# Patient Record
Sex: Female | Born: 1995
Health system: Southern US, Community
[De-identification: ages and names within clinical notes are randomized; demographics above are authoritative.]

## PROBLEM LIST (undated history)

## (undated) DIAGNOSIS — K3189 Other diseases of stomach and duodenum: Secondary | ICD-10-CM

## (undated) DIAGNOSIS — F329 Major depressive disorder, single episode, unspecified: Secondary | ICD-10-CM

## (undated) DIAGNOSIS — K589 Irritable bowel syndrome without diarrhea: Secondary | ICD-10-CM

## (undated) DIAGNOSIS — M797 Fibromyalgia: Secondary | ICD-10-CM

## (undated) DIAGNOSIS — F32A Depression, unspecified: Secondary | ICD-10-CM

## (undated) DIAGNOSIS — F419 Anxiety disorder, unspecified: Secondary | ICD-10-CM

## (undated) DIAGNOSIS — Z803 Family history of malignant neoplasm of breast: Secondary | ICD-10-CM

## (undated) DIAGNOSIS — F319 Bipolar disorder, unspecified: Secondary | ICD-10-CM

## (undated) HISTORY — DX: Other diseases of stomach and duodenum: K31.89

## (undated) HISTORY — PX: GANGLION CYST EXCISION: SHX1691

## (undated) HISTORY — DX: Family history of malignant neoplasm of breast: Z80.3

## (undated) HISTORY — DX: Bipolar disorder, unspecified: F31.9

## (undated) HISTORY — DX: Irritable bowel syndrome, unspecified: K58.9

## (undated) HISTORY — DX: Fibromyalgia: M79.7

---
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
## 2015-02-16 ENCOUNTER — Ambulatory Visit (INDEPENDENT_AMBULATORY_CARE_PROVIDER_SITE_OTHER): Payer: BLUE CROSS/BLUE SHIELD | Admitting: Family Medicine

## 2015-02-16 VITALS — BP 110/60 | HR 63 | Temp 99.4°F | Resp 16 | Ht 68.0 in | Wt 185.0 lb

## 2015-02-16 DIAGNOSIS — R21 Rash and other nonspecific skin eruption: Secondary | ICD-10-CM | POA: Diagnosis not present

## 2015-02-16 DIAGNOSIS — R509 Fever, unspecified: Secondary | ICD-10-CM | POA: Diagnosis not present

## 2015-02-16 LAB — POCT CBC
Granulocyte percent: 77.8 %G (ref 37–80)
HCT, POC: 39.6 % (ref 37.7–47.9)
Hemoglobin: 13.2 g/dL (ref 12.2–16.2)
Lymph, poc: 1.9 (ref 0.6–3.4)
MCH, POC: 30.2 pg (ref 27–31.2)
MCHC: 33.4 g/dL (ref 31.8–35.4)
MCV: 90.3 fL (ref 80–97)
MID (cbc): 0.7 (ref 0–0.9)
MPV: 7.6 fL (ref 0–99.8)
POC Granulocyte: 8.9 — AB (ref 2–6.9)
POC LYMPH PERCENT: 16.2 %L (ref 10–50)
POC MID %: 6 %M (ref 0–12)
Platelet Count, POC: 222 10*3/uL (ref 142–424)
RBC: 4.39 M/uL (ref 4.04–5.48)
RDW, POC: 12.7 %
WBC: 11.5 10*3/uL — AB (ref 4.6–10.2)

## 2015-02-16 LAB — COMPREHENSIVE METABOLIC PANEL
ALT: 8 U/L (ref 5–32)
AST: 14 U/L (ref 12–32)
Albumin: 4.2 g/dL (ref 3.6–5.1)
Alkaline Phosphatase: 55 U/L (ref 47–176)
BUN: 9 mg/dL (ref 7–20)
CO2: 23 mmol/L (ref 20–31)
Calcium: 9.2 mg/dL (ref 8.9–10.4)
Chloride: 101 mmol/L (ref 98–110)
Creat: 0.76 mg/dL (ref 0.50–1.00)
Glucose, Bld: 77 mg/dL (ref 65–99)
Potassium: 4.1 mmol/L (ref 3.8–5.1)
Sodium: 141 mmol/L (ref 135–146)
Total Bilirubin: 0.4 mg/dL (ref 0.2–1.1)
Total Protein: 7 g/dL (ref 6.3–8.2)

## 2015-02-16 LAB — POCT URINALYSIS DIPSTICK
Glucose, UA: NEGATIVE
Ketones, UA: 15
Leukocytes, UA: NEGATIVE
Nitrite, UA: NEGATIVE
Protein, UA: 30
Spec Grav, UA: 1.025
Urobilinogen, UA: 1
pH, UA: 5.5

## 2015-02-16 LAB — POCT RAPID STREP A (OFFICE): Rapid Strep A Screen: NEGATIVE

## 2015-02-16 LAB — POCT UA - MICROSCOPIC ONLY
Bacteria, U Microscopic: NEGATIVE
Casts, Ur, LPF, POC: NEGATIVE
Crystals, Ur, HPF, POC: NEGATIVE
Yeast, UA: NEGATIVE

## 2015-02-16 LAB — POCT URINE PREGNANCY: Preg Test, Ur: NEGATIVE

## 2015-02-16 LAB — POCT SEDIMENTATION RATE: POCT SED RATE: 30 mm/hr — AB (ref 0–22)

## 2015-02-16 MED ORDER — DOXYCYCLINE HYCLATE 100 MG PO CAPS: 100.0000 mg | ORAL_CAPSULE | Freq: Two times a day (BID) | ORAL | Status: DC

## 2015-02-16 MED ORDER — HYDROCODONE-ACETAMINOPHEN 7.5-325 MG/15ML PO SOLN: 5.0000 mL | ORAL | Status: DC | PRN

## 2015-02-16 MED ORDER — PROMETHAZINE HCL 25 MG PO TABS: 25.0000 mg | ORAL_TABLET | Freq: Three times a day (TID) | ORAL | Status: DC | PRN

## 2015-02-16 NOTE — Progress Notes (Signed)
Subjective:    Patient ID: Kathy Cole, female    DOB: March 03, 1996, 19 y.o.   MRN: [id number] This chart was scribed for Norberto Sorenson, MD by Jolene Provost, Medical Scribe. This patient was seen in Room 1 and the patient's care was started a 12:20 PM.  Chief Complaint  Patient presents with  . Abrasion    Onset Saturday Bilateral arms and legs  . Nasal Congestion    onset saturday  . Sore Throat    " "  . Fever    " "    HPI HPI Comments: Kathy Cole is a 18 y.o. female who presents to [facility] complaining of cough, fever, sore throat and rash for the last four days. Pt endorses associated bilateral ear pain, worse with swallowing. Pt states her sx started with cough and sore throat five days ago, and three days ago she developed a fever and rash. Her fever and rash cleared two days ago, and then recurred yesterday and was dramatically worse today. The rash has spread all over her body. Pt denies abdominal pain, vaginal discharge, diarrhea, or vomiting.   She states she has been outside a lot this summer, but is not aware of being bitten by a tick. Pt is UTD on immunizations.   Past Medical History  Diagnosis Date  . Anxiety   . Depression    Current Outpatient Prescriptions on File Prior to Visit  Medication Sig Dispense Refill  . clonazePAM (KLONOPIN) 1 MG tablet Take 0.5-1 mg by mouth 2 (two) times daily as needed for anxiety.    . divalproex (DEPAKOTE ER) 250 MG 24 hr tablet Take 3 tablets (750 mg total) by mouth at bedtime. Mood control 30 tablet 0  . gabapentin (NEURONTIN) 100 MG capsule Take 1 capsule (100 mg total) by mouth 3 (three) times daily. 90 capsule 0  . levonorgestrel (MIRENA) 20 MCG/24HR IUD 1 each by Intrauterine route once.     No current facility-administered medications on file prior to visit.   No Known Allergies  Review of Systems  Constitutional: Positive for fever, chills, diaphoresis, activity change, appetite change and fatigue. Negative for unexpected weight  change.  HENT: Positive for congestion, ear pain, postnasal drip, rhinorrhea, sneezing, sore throat, trouble swallowing and voice change. Negative for ear discharge, hearing loss, mouth sores and nosebleeds.   Respiratory: Positive for cough. Negative for shortness of breath.   Gastrointestinal: Negative for nausea, vomiting, abdominal pain, diarrhea, constipation and abdominal distention.  Endocrine: Negative for polyuria.  Genitourinary: Positive for decreased urine volume.  Musculoskeletal: Positive for myalgias and neck pain. Negative for neck stiffness.  Skin: Positive for rash.  Allergic/Immunologic: Negative for immunocompromised state.  Neurological: Positive for dizziness, light-headedness and headaches. Negative for seizures, syncope and speech difficulty.  Hematological: Positive for adenopathy.  Psychiatric/Behavioral: Negative for sleep disturbance.       Objective:  BP 110/60 mmHg  Pulse 63  Temp(Src) 99.4 F (37.4 C) (Oral)  Resp 16  Ht 5\' 8"  (1.727 m)  Wt 185 lb (83.915 kg)  BMI 28.14 kg/m2  SpO2 93%  Physical Exam  Constitutional: She is oriented to person, place, and time. She appears well-developed and well-nourished. No distress.  HENT:  Head: Normocephalic and atraumatic.  Right TM normal. Left TM bulging and erythematous. Oropharynx tonsils with severe erythema, 2+ with copious exudates.   Eyes: Pupils are equal, round, and reactive to light.  Neck: Neck supple.  Cardiovascular: Normal rate, S1 normal and S2 normal.  No murmur heard. Tachycardic with regular rhythm.   Pulmonary/Chest: Effort normal. No respiratory distress.  Abdominal: Bowel sounds are normal. There is no tenderness. There is no rebound.  Negative Murphy's sign, no hepatosplenomegaly.   Musculoskeletal: Normal range of motion.  Neurological: She is alert and oriented to person, place, and time. Coordination normal.  Skin: Skin is warm and dry. She is not diaphoretic.  Blanching pinpoint  erythematous macules starting on the palms and MCP joints and fingers going up arm coalescing to a blanching erythematous macules over extensor surface of elbows and axilla. Face is spared. Increased rash at waistline and groin.   Psychiatric: She has a normal mood and affect. Her behavior is normal.  Nursing note and vitals reviewed.      Assessment & Plan:   1. Fever, unspecified fever cause   2. Rash and nonspecific skin eruption   Suspect RMSF as pt is VERY spotted but no known tick exposure so could be extreme viral exanthem.  Start doxy - get in 2 doses/d and recheck w/ [facility] in 24 hrs unless having substantial improvements - then f/u w/ me in 2-3d.  If worse or any alarm sxs -> to ER immed. Pt does live w/ several roommates who will be avail to help monitor her illness in case of worsening.  Rest, push fluids. oow x 3d -> rec provider note for RTW release when improving.  Orders Placed This Encounter  Procedures  . Culture, Group A Strep  . Epstein-Barr virus VCA antibody panel  . [place] spotted fvr abs pnl(IgG+IgM)  . Comprehensive metabolic panel  . POCT CBC  . POCT SEDIMENTATION RATE  . POCT UA - Microscopic Only  . POCT urinalysis dipstick  . POCT urine pregnancy  . POCT rapid strep A    Meds ordered this encounter  Medications  . Brexpiprazole (REXULTI) 2 MG TABS    Sig: Take 1 tablet by mouth once.  . doxycycline (VIBRAMYCIN) 100 MG capsule    Sig: Take 1 capsule (100 mg total) by mouth 2 (two) times daily.    Dispense:  20 capsule    Refill:  0  . HYDROcodone-acetaminophen (HYCET) 7.5-325 mg/15 ml solution    Sig: Take 5-10 mLs by mouth every 4 (four) hours as needed for moderate pain.    Dispense:  140 mL    Refill:  0  . promethazine (PHENERGAN) 25 MG tablet    Sig: Take 1 tablet (25 mg total) by mouth every 8 (eight) hours as needed for nausea or vomiting.    Dispense:  20 tablet    Refill:  0  . HYDROcodone-acetaminophen (HYCET) 7.5-325 mg/15 ml  solution    Sig: Take 5-10 mLs by mouth every 4 (four) hours as needed for moderate pain.    Dispense:  140 mL    Refill:  0    I personally performed the services described in this documentation, which was scribed in my presence. The recorded information has been reviewed and considered, and addended by me as needed.  Norberto Sorenson, MD MPH  Results for orders placed or performed in visit on 02/16/15  Culture, Group A Strep  Result Value Ref Range   Organism ID, Bacteria Normal Upper Respiratory Flora    Organism ID, Bacteria No Beta Hemolytic Streptococci Isolated   Epstein-Barr virus VCA antibody panel  Result Value Ref Range   EBV VCA IgG 178.0 (H) <18.0 U/mL   EBV VCA IgM <10.0 <36.0 U/mL   EBV  EA IgG <5.0 <9.0 U/mL   EBV NA IgG 335.0 (H) <18.0 U/mL  [place] spotted fvr abs pnl(IgG+IgM)  Result Value Ref Range   RMSF IgG 3.40 (H) IV   RMSF IgM 0.12 IV  Comprehensive metabolic panel  Result Value Ref Range   Sodium 141 135 - 146 mmol/L   Potassium 4.1 3.8 - 5.1 mmol/L   Chloride 101 98 - 110 mmol/L   CO2 23 20 - 31 mmol/L   Glucose, Bld 77 65 - 99 mg/dL   BUN 9 7 - 20 mg/dL   Creat 1.61 0.96 - 0.45 mg/dL   Total Bilirubin 0.4 0.2 - 1.1 mg/dL   Alkaline Phosphatase 55 47 - 176 U/L   AST 14 12 - 32 U/L   ALT 8 5 - 32 U/L   Total Protein 7.0 6.3 - 8.2 g/dL   Albumin 4.2 3.6 - 5.1 g/dL   Calcium 9.2 8.9 - 40.9 mg/dL  POCT CBC  Result Value Ref Range   WBC 11.5 (A) 4.6 - 10.2 K/uL   Lymph, poc 1.9 0.6 - 3.4   POC LYMPH PERCENT 16.2 10 - 50 %L   MID (cbc) 0.7 0 - 0.9   POC MID % 6.0 0 - 12 %M   POC Granulocyte 8.9 (A) 2 - 6.9   Granulocyte percent 77.8 37 - 80 %G   RBC 4.39 4.04 - 5.48 M/uL   Hemoglobin 13.2 12.2 - 16.2 g/dL   HCT, POC 81.1 91.4 - 47.9 %   MCV 90.3 80 - 97 fL   MCH, POC 30.2 27 - 31.2 pg   MCHC 33.4 31.8 - 35.4 g/dL   RDW, POC 78.2 %   Platelet Count, POC 222 142 - 424 K/uL   MPV 7.6 0 - 99.8 fL  POCT SEDIMENTATION RATE  Result Value Ref Range    POCT SED RATE 30 (A) 0 - 22 mm/hr  POCT UA - Microscopic Only  Result Value Ref Range   WBC, Ur, HPF, POC 0-3    RBC, urine, microscopic 3-8    Bacteria, U Microscopic neg    Mucus, UA large    Epithelial cells, urine per micros 4-6    Crystals, Ur, HPF, POC neg    Casts, Ur, LPF, POC neg    Yeast, UA neg   POCT urinalysis dipstick  Result Value Ref Range   Color, UA Dk yellow    Clarity, UA clear    Glucose, UA neg    Bilirubin, UA small    Ketones, UA 15    Spec Grav, UA 1.025    Blood, UA trace-lysed    pH, UA 5.5    Protein, UA 30    Urobilinogen, UA 1.0    Nitrite, UA neg    Leukocytes, UA Negative Negative  POCT urine pregnancy  Result Value Ref Range   Preg Test, Ur Negative Negative  POCT rapid strep A  Result Value Ref Range   Rapid Strep A Screen Negative Negative

## 2015-02-16 NOTE — Patient Instructions (Signed)
[place] Spotted Fever °[place] Spotted Fever (RMSF) is the oldest known tick-borne disease of people in the [place]. This disease was named because it was first described among people in the [place] area who had an illness characterized by a rash with red-purple-black spots. This disease is caused by a rickettsia (Rickettsia rickettsii), a bacteria carried by the tick. °The [place] wood tick and the [place] dog tick acquire and transmit the RMSF bacteria (pictures NOT actual size). When a larval, nymphal, or adult tick feeds on an infected rodent or larger animal, the tick can become infected. Infected adult ticks then feed on people who may then get RMSF. The tick transmits the disease to humans during a prolonged period of feeding that lasts many hours, days, or even a couple weeks. The bite is painless and frequently goes unnoticed. An infected female tick may also pass the rickettsial bacteria to her eggs that then may mature to be infected adult ticks. °The rickettsia that causes RMSF can also get into a person's body through damaged skin. A tick bite is not necessary. People can get RMSF if they crush a tick and get its blood or body fluids on their skin through a small cut or sore.  °DIAGNOSIS °Diagnosis is made by laboratory tests.  °TREATMENT °Treatment is with antibiotics (medications that kill rickettsia and other bacteria). Immediate treatment usually prevents death. °GEOGRAPHIC RANGE °This disease was reported only in the [place] until 1931. RMSF has more recently been described among individuals in all states except [place], [place], and [place]. The highest reported incidences of RMSF now occur among residents of [place], [place], [place], and the [place]. °TIME OF YEAR  °Most cases are diagnosed during late spring and summer when ticks are most active. However, especially in the warmer southern states, a few cases occur during the winter. °SYMPTOMS   °· Symptoms of RMSF begin from 2 to 14 days after a tick bite. The most common early symptoms are fever, muscle aches, and headache followed by nausea (feeling sick to your stomach) or vomiting. °· The RMSF rash is typically delayed until 3 or more days after symptom onset, and eventually develops in 9 of 10 infected patients by the fifth day of illness. °If the disease is not treated it can cause death. If you get a fever, headache, muscle aches, rash, nausea, or vomiting within 2 weeks of a possible tick bite or exposure, you should see your caregiver immediately. °PREVENTION °Ticks prefer to hide in shady, moist ground litter. They can often be found above the ground clinging to tall grass, brush, shrubs and low tree branches. They also inhabit lawns and gardens, especially at the edges of woodlands and around old stone walls. Within the areas where ticks generally live, no naturally vegetated area can be considered completely free of infected ticks. The best precaution against RMSF is to avoid contact with soil, leaf litter, and vegetation as much as possible in tick-infested areas. For those who enjoy gardening or walking in their yards, clear brush and mow tall grass around houses and at the edges of gardens. This may help reduce the tick population in the immediate area. Applications of chemical insecticides by a licensed professional in the spring (late May) and fall (September) will also control ticks, especially in heavily infested areas. Treatment will never get rid of all the ticks. Getting rid of small animal populations that host ticks will also decrease the tick population. When working in the garden, pruning   shrubs, or handling soil and vegetation, wear light-colored protective clothing and gloves. Spot-check often to prevent ticks from reaching the skin. Ticks cannot jump or fly. They will not drop from an above-ground perch onto a passing animal. Once a tick gains access to human skin it climbs  upward until it reaches a more protected area. For example, the back of the knee, groin, navel, armpit, ears, or nape of the neck. It then begins the slow process of embedding itself in the skin. °Campers, hikers, field workers, and others who spend time in wooded, brushy, or tall grassy areas can avoid exposure to ticks by using the following precautions: °· Wear light-colored clothing with a tight weave to spot ticks more easily and prevent contact with the skin. °· Wear long pants tucked into socks, long-sleeved shirts tucked into pants and enclosed shoes or boots along with insect repellent. °· Spray clothes with insect repellent containing either DEET or Permethrin. Only DEET can be used on exposed skin. Follow the manufacturer's directions carefully. °· Wear a hat and keep long hair pulled back. °· Stay on cleared, well-worn trails whenever possible. °· Spot-check yourself and others often for the presence of ticks on clothes. If you find one, there are likely to be others. Check thoroughly. °· Remove clothes after leaving tick-infested areas. If possible, wash them to eliminate any unseen ticks. Check yourself, your children and any pets from head to toe for the presence of ticks. °· Shower and shampoo. °You can greatly reduce your chances of contracting RMSF if you remove attached ticks as soon as possible. Regular checks of the body, including all body sites covered by hair (head, armpits, genitals), allow removal of the tick before rickettsial transmission. To remove an attached tick, use a forceps or tweezers to detach the intact tick without leaving mouth parts in the skin. The tick bite wound should be cleansed after tick removal. °Remember the most common symptoms of RMSF are fever, muscle aches, headache, and nausea or vomiting with a later onset of rash. If you get these symptoms after a tick bite and while living in an area where RMSF is found, RMSF should be suspected. If the disease is not  treated, it can cause death. See your caregiver immediately if you get these symptoms. Do this even if not aware of a tick bite. °Document Released: 10/09/2000 Document Revised: 11/11/2013 Document Reviewed: 06/01/2009 °ExitCare® Patient Information ©2015 [employer]. This information is not intended to replace advice given to you by your health care provider. Make sure you discuss any questions you have with your health care provider. ° °

## 2015-02-17 LAB — EPSTEIN-BARR VIRUS VCA ANTIBODY PANEL
EBV EA IgG: <5 U/mL (ref <9.0)
EBV NA IgG: 335 U/mL — ABNORMAL HIGH (ref <18.0)
EBV VCA IgG: 178 U/mL — ABNORMAL HIGH (ref <18.0)
EBV VCA IgM: <10 U/mL (ref <36.0)

## 2015-02-17 LAB — ROCKY MTN SPOTTED FVR ABS PNL(IGG+IGM)
RMSF IgG: 3.4 IV — ABNORMAL HIGH
RMSF IgM: 0.12 IV

## 2015-02-18 LAB — CULTURE, GROUP A STREP: Organism ID, Bacteria: NORMAL

## 2015-03-01 ENCOUNTER — Encounter: Payer: Self-pay | Admitting: Family Medicine

## 2015-11-17 ENCOUNTER — Ambulatory Visit (INDEPENDENT_AMBULATORY_CARE_PROVIDER_SITE_OTHER): Payer: BLUE CROSS/BLUE SHIELD | Admitting: Family Medicine

## 2015-11-17 VITALS — BP 118/76 | HR 61 | Temp 97.9°F | Resp 16 | Ht 68.0 in | Wt 186.0 lb

## 2015-11-17 DIAGNOSIS — R112 Nausea with vomiting, unspecified: Secondary | ICD-10-CM

## 2015-11-17 DIAGNOSIS — R300 Dysuria: Secondary | ICD-10-CM

## 2015-11-17 DIAGNOSIS — R109 Unspecified abdominal pain: Secondary | ICD-10-CM

## 2015-11-17 DIAGNOSIS — N390 Urinary tract infection, site not specified: Secondary | ICD-10-CM | POA: Diagnosis not present

## 2015-11-17 DIAGNOSIS — R8281 Pyuria: Secondary | ICD-10-CM

## 2015-11-17 LAB — COMPLETE METABOLIC PANEL WITH GFR
ALT: 10 U/L (ref 6–29)
AST: 13 U/L (ref 10–30)
Albumin: 4.3 g/dL (ref 3.6–5.1)
Alkaline Phosphatase: 60 U/L (ref 33–115)
BUN: 13 mg/dL (ref 7–25)
CO2: 21 mmol/L (ref 20–31)
Calcium: 9.4 mg/dL (ref 8.6–10.2)
Chloride: 104 mmol/L (ref 98–110)
Creat: 0.68 mg/dL (ref 0.50–1.10)
GFR, Est African American: >89 mL/min (ref >=60)
GFR, Est Non African American: >89 mL/min (ref >=60)
Glucose, Bld: 89 mg/dL (ref 65–99)
Potassium: 4.4 mmol/L (ref 3.5–5.3)
Sodium: 136 mmol/L (ref 135–146)
Total Bilirubin: 0.5 mg/dL (ref 0.2–1.2)
Total Protein: 7.2 g/dL (ref 6.1–8.1)

## 2015-11-17 LAB — POCT CBC
Granulocyte percent: 75.2 %G (ref 37–80)
HCT, POC: 39.3 % (ref 37.7–47.9)
Hemoglobin: 14.2 g/dL (ref 12.2–16.2)
Lymph, poc: 2.6 (ref 0.6–3.4)
MCH, POC: 31.9 pg — AB (ref 27–31.2)
MCHC: 36.1 g/dL — AB (ref 31.8–35.4)
MCV: 88.3 fL (ref 80–97)
MID (cbc): 0.7 (ref 0–0.9)
MPV: 7.5 fL (ref 0–99.8)
POC Granulocyte: 10 — AB (ref 2–6.9)
POC LYMPH PERCENT: 19.5 %L (ref 10–50)
POC MID %: 5.3 %M (ref 0–12)
Platelet Count, POC: 260 10*3/uL (ref 142–424)
RBC: 4.46 M/uL (ref 4.04–5.48)
RDW, POC: 12.7 %
WBC: 13.3 10*3/uL — AB (ref 4.6–10.2)

## 2015-11-17 LAB — POCT URINALYSIS DIP (MANUAL ENTRY)
Bilirubin, UA: NEGATIVE
Glucose, UA: NEGATIVE
Ketones, POC UA: NEGATIVE
Nitrite, UA: NEGATIVE
Protein Ur, POC: 30 — AB
Spec Grav, UA: 1.02
Urobilinogen, UA: 0.2
pH, UA: 5.5

## 2015-11-17 LAB — POC MICROSCOPIC URINALYSIS (UMFC): Mucus: ABSENT

## 2015-11-17 LAB — POCT URINE PREGNANCY: Preg Test, Ur: NEGATIVE

## 2015-11-17 MED ORDER — KETOROLAC TROMETHAMINE 60 MG/2ML IM SOLN
60.0000 mg | Freq: Once | INTRAMUSCULAR | Status: AC
  Administered 2015-11-17: 60 mg via INTRAMUSCULAR

## 2015-11-17 MED ORDER — CIPROFLOXACIN HCL 250 MG PO TABS: 500.0000 mg | ORAL_TABLET | Freq: Two times a day (BID) | ORAL | Status: DC

## 2015-11-17 MED ORDER — ONDANSETRON 8 MG PO TBDP: 8.0000 mg | ORAL_TABLET | Freq: Three times a day (TID) | ORAL | Status: DC | PRN

## 2015-11-17 MED ORDER — CIPROFLOXACIN HCL 500 MG PO TABS: 500.0000 mg | ORAL_TABLET | Freq: Two times a day (BID) | ORAL | Status: DC

## 2015-11-17 MED ORDER — HYDROCODONE-ACETAMINOPHEN 5-325 MG PO TABS: 1.0000 | ORAL_TABLET | Freq: Four times a day (QID) | ORAL | Status: DC | PRN

## 2015-11-17 NOTE — Progress Notes (Signed)
Patient ID: Kathy Cole MRN: [id number], DOB: [birth year], 20 y.o. Date of Encounter: 11/17/2015, 10:23 AM  Primary Physician: No primary care provider on file.  Chief Complaint:  Chief Complaint  Patient presents with  . Nausea    Since this morning  . Back Pain    Lower, since yesterday    HPI: 20 y.o. year old female presents with 4 day history of dysuria, urgency, and frequency. Last UTI was about a month ago No hematuria LMP: irregular, last one in February No sick contacts, recent antibiotics(She was treated for a UTI 6 weeks ago with antibiotics), or recent travels.   No vaginal discharge,  fever  Patient is nauseated with vomiting.  Past Medical History  Diagnosis Date  . Anxiety   . Depression      Home Meds: Prior to Admission medications   Medication Sig Start Date End Date Taking? Authorizing Provider  clonazePAM (KLONOPIN) 1 MG tablet Take 0.5-1 mg by mouth 2 (two) times daily as needed for anxiety. Reported on 11/17/2015    Historical Provider, MD  levonorgestrel (MIRENA) 20 MCG/24HR IUD 1 each by Intrauterine route once. Reported on 11/17/2015    Historical Provider, MD    Allergies: No Known Allergies  Social History   Social History  . Marital Status: Single    Spouse Name: N/A  . Number of Children: N/A  . Years of Education: N/A   Occupational History  . Not on file.   Social History Main Topics  . Smoking status: Former Games developer  . Smokeless tobacco: Not on file  . Alcohol Use: 0.0 oz/week    0 Standard drinks or equivalent per week     Comment: 3 drinks liquor at a party monthly  . Drug Use: Yes    Special: Marijuana     Comment: THC once month 1/8th gram  . Sexual Activity: Yes    Birth Control/ Protection: IUD   Other Topics Concern  . Not on file   Social History Narrative     Review of Systems: Constitutional: negative for chills, fever, night sweats or weight changes Cardiovascular: negative for chest pain or  palpitations Respiratory: negative for hemoptysis, wheezing, or shortness of breath Abdominal: negative for abdominal pain, nausea, vomiting or diarrhea Dermatological: negative for rash Neurologic: negative for headache   Physical Exam: Blood pressure 118/76, pulse 61, temperature 97.9 F (36.6 C), temperature source Oral, resp. rate 16, height  (1.727 m), weight 186 lb (84.369 kg), SpO2 97 %., Body mass index is 28.29 kg/(m^2). General: Well developed, well nourished, in no acute distress. Head: Normocephalic, atraumatic, eyes without discharge, sclera non-icteric, nares are congested. Bilateral auditory canals clear, TM's are without perforation, pearly grey with reflective cone of light bilaterally. Serous effusion bilaterally behind TM's. Maxillary sinus TTP. Oral cavity moist, dentition normal. Posterior pharynx with post nasal drip and mild erythema. No peritonsillar abscess or tonsillar exudate. Neck: Supple. No thyromegaly. Full ROM. No lymphadenopathy. Lungs: Coarse breath sounds bilaterally without Clear bilaterally to auscultation without wheezes, rales, or rhonchi. Breathing is unlabored.  Heart: RRR with S1 S2. No murmurs, rubs, or gallops appreciated. Abdomen: Soft, non-tender, non-distended with normoactive bowel sounds. No hepatosplenomegaly. No rebound/guarding. No obvious abdominal masses. McBurney's, Rovsing's, Iliopsoas, and table jar all negative. Msk:  Strength and tone normal for age. Extremities: No clubbing or cyanosis. No edema. Neuro: Alert and oriented X 3. Moves all extremities spontaneously. CNII-XII grossly in tact. Psych:  Responds to questions appropriately with a normal  affect.   Labs: Results for orders placed or performed in visit on 11/17/15  POCT urinalysis dipstick  Result Value Ref Range   Color, UA yellow yellow   Clarity, UA clear clear   Glucose, UA negative negative   Bilirubin, UA negative negative   Ketones, POC UA negative negative    Spec Grav, UA 1.020    Blood, UA moderate (A) negative   pH, UA 5.5    Protein Ur, POC =30 (A) negative   Urobilinogen, UA 0.2    Nitrite, UA Negative Negative   Leukocytes, UA Trace (A) Negative  POCT Microscopic Urinalysis ([facility])  Result Value Ref Range   WBC,UR,HPF,POC Many (A) None WBC/hpf   RBC,UR,HPF,POC Moderate (A) None RBC/hpf   Bacteria Many (A) None, Too numerous to count   Mucus Absent Absent   Epithelial Cells, UR Per Microscopy Many (A) None, Too numerous to count cells/hpf  POCT urine pregnancy  Result Value Ref Range   Preg Test, Ur Negative Negative  POCT CBC  Result Value Ref Range   WBC 13.3 (A) 4.6 - 10.2 K/uL   Lymph, poc 2.6 0.6 - 3.4   POC LYMPH PERCENT 19.5 10 - 50 %L   MID (cbc) 0.7 0 - 0.9   POC MID % 5.3 0 - 12 %M   POC Granulocyte 10.0 (A) 2 - 6.9   Granulocyte percent 75.2 37 - 80 %G   RBC 4.46 4.04 - 5.48 M/uL   Hemoglobin 14.2 12.2 - 16.2 g/dL   HCT, POC 16.139.3 09.637.7 - 47.9 %   MCV 88.3 80 - 97 fL   MCH, POC 31.9 (A) 27 - 31.2 pg   MCHC 36.1 (A) 31.8 - 35.4 g/dL   RDW, POC 04.512.7 %   Platelet Count, POC 260 142 - 424 K/uL   MPV 7.5 0 - 99.8 fL   Patient had significant relief after receiving the Toradol shot   ASSESSMENT AND PLAN:  20 y.o. year old female with flank pain that has worsened and is fluctuating in intensity over the past several hours.  It follows a week of gradually resolving dysuria not alleviated by cranberry juice. -   ICD-9-CM ICD-10-CM   1. Right flank pain 789.09 R10.9 POCT urinalysis dipstick     POCT Microscopic Urinalysis ([facility])     POCT urine pregnancy     POCT CBC     COMPLETE METABOLIC PANEL WITH GFR     ketorolac (TORADOL) injection 60 mg     ciprofloxacin (CIPRO) 500 MG tablet     HYDROcodone-acetaminophen (NORCO) 5-325 MG tablet  2. Dysuria 788.1 R30.0 Urine culture     ciprofloxacin (CIPRO) 500 MG tablet  3. Nausea and vomiting, intractability of vomiting not specified, unspecified vomiting type 787.01 R11.2  ondansetron (ZOFRAN-ODT) 8 MG disintegrating tablet  4. Pyuria 791.9 N39.0 ciprofloxacin (CIPRO) 500 MG tablet     DISCONTINUED: ciprofloxacin (CIPRO) tablet 500 mg   -Tylenol/Motrin prn -Rest/fluids -RTC precautions -RTC 3-5 days if no improvement  Signed, Elvina SidleKurt Elic Vencill, MD 11/17/2015 10:23 AM After I arranged a CT scan, patient talked with her father who said that CT scan would be very expensive and they would like to wait and see her she response to the antibiotic.  I suggest patient go to the emergency room if pain worsens.

## 2015-11-17 NOTE — Patient Instructions (Signed)
     IF you received an x-ray today, you will receive an invoice from [facility]. Please contact [facility] at [phone] with questions or concerns regarding your invoice.   IF you received labwork today, you will receive an invoice from [facility]. Please contact [facility] at [phone] with questions or concerns regarding your invoice.   Our billing staff will not be able to assist you with questions regarding bills from these companies.  You will be contacted with the lab results as soon as they are available. The fastest way to get your results is to activate your My Chart account. Instructions are located on the last page of this paperwork. If you have not heard from us regarding the results in 2 weeks, please contact this office.      

## 2015-11-20 LAB — URINE CULTURE: Colony Count: >=100000

## 2016-02-02 DIAGNOSIS — F902 Attention-deficit hyperactivity disorder, combined type: Secondary | ICD-10-CM | POA: Diagnosis not present

## 2016-02-02 DIAGNOSIS — F603 Borderline personality disorder: Secondary | ICD-10-CM | POA: Diagnosis not present

## 2016-02-02 DIAGNOSIS — F411 Generalized anxiety disorder: Secondary | ICD-10-CM | POA: Diagnosis not present

## 2016-02-15 ENCOUNTER — Emergency Department (HOSPITAL_COMMUNITY)
Admission: EM | Admit: 2016-02-15 | Discharge: 2016-02-15 | Disposition: A | Discharge status: Home / Self Care | Payer: BLUE CROSS/BLUE SHIELD | Attending: Emergency Medicine | Admitting: Emergency Medicine

## 2016-02-15 ENCOUNTER — Emergency Department (HOSPITAL_COMMUNITY): Payer: BLUE CROSS/BLUE SHIELD

## 2016-02-15 ENCOUNTER — Encounter (HOSPITAL_COMMUNITY): Payer: Self-pay | Admitting: Emergency Medicine

## 2016-02-15 DIAGNOSIS — R102 Pelvic and perineal pain: Secondary | ICD-10-CM | POA: Diagnosis not present

## 2016-02-15 DIAGNOSIS — Z87891 Personal history of nicotine dependence: Secondary | ICD-10-CM | POA: Insufficient documentation

## 2016-02-15 DIAGNOSIS — R52 Pain, unspecified: Secondary | ICD-10-CM

## 2016-02-15 LAB — URINALYSIS, ROUTINE W REFLEX MICROSCOPIC
Bilirubin Urine: NEGATIVE
Glucose, UA: NEGATIVE mg/dL
Ketones, ur: NEGATIVE mg/dL
Leukocytes, UA: NEGATIVE
Nitrite: NEGATIVE
Protein, ur: NEGATIVE mg/dL
Specific Gravity, Urine: 1.035 — ABNORMAL HIGH (ref 1.005–1.030)
pH: 5.5 (ref 5.0–8.0)

## 2016-02-15 LAB — URINE MICROSCOPIC-ADD ON

## 2016-02-15 LAB — COMPREHENSIVE METABOLIC PANEL
ALT: 12 U/L — ABNORMAL LOW (ref 14–54)
AST: 17 U/L (ref 15–41)
Albumin: 4.5 g/dL (ref 3.5–5.0)
Alkaline Phosphatase: 45 U/L (ref 38–126)
Anion gap: 7 (ref 5–15)
BUN: 12 mg/dL (ref 6–20)
CO2: 22 mmol/L (ref 22–32)
Calcium: 8.7 mg/dL — ABNORMAL LOW (ref 8.9–10.3)
Chloride: 108 mmol/L (ref 101–111)
Creatinine, Ser: 0.5 mg/dL (ref 0.44–1.00)
GFR calc Af Amer: >60 mL/min (ref >60)
GFR calc non Af Amer: >60 mL/min (ref >60)
Glucose, Bld: 80 mg/dL (ref 65–99)
Potassium: 3.8 mmol/L (ref 3.5–5.1)
Sodium: 137 mmol/L (ref 135–145)
Total Bilirubin: 0.5 mg/dL (ref 0.3–1.2)
Total Protein: 7 g/dL (ref 6.5–8.1)

## 2016-02-15 LAB — LIPASE, BLOOD: Lipase: 26 U/L (ref 11–51)

## 2016-02-15 LAB — WET PREP, GENITAL
Clue Cells Wet Prep HPF POC: NONE SEEN
Sperm: NONE SEEN
Trich, Wet Prep: NONE SEEN
Yeast Wet Prep HPF POC: NONE SEEN

## 2016-02-15 LAB — CBC
HCT: 35.2 % — ABNORMAL LOW (ref 36.0–46.0)
Hemoglobin: 12.2 g/dL (ref 12.0–15.0)
MCH: 30.8 pg (ref 26.0–34.0)
MCHC: 34.7 g/dL (ref 30.0–36.0)
MCV: 88.9 fL (ref 78.0–100.0)
Platelets: 220 10*3/uL (ref 150–400)
RBC: 3.96 MIL/uL (ref 3.87–5.11)
RDW: 12.8 % (ref 11.5–15.5)
WBC: 8.7 10*3/uL (ref 4.0–10.5)

## 2016-02-15 LAB — I-STAT BETA HCG BLOOD, ED (MC, WL, AP ONLY): I-stat hCG, quantitative: <5 m[IU]/mL (ref <5)
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
## 2016-02-15 NOTE — ED Triage Notes (Signed)
Pt reports midline lower abd pain that has gradually gotten worse. Hx of IUD placement. Has had some spotting, but no discharge. Small amount of dysuria, hx of recurrent UTIs.

## 2016-02-15 NOTE — ED Notes (Signed)
US at bedside

## 2016-02-16 LAB — GC/CHLAMYDIA PROBE AMP (~~LOC~~) NOT AT ARMC
Chlamydia: NEGATIVE
Neisseria Gonorrhea: NEGATIVE

## 2016-04-20 DIAGNOSIS — F411 Generalized anxiety disorder: Secondary | ICD-10-CM | POA: Diagnosis not present

## 2016-04-20 DIAGNOSIS — F3132 Bipolar disorder, current episode depressed, moderate: Secondary | ICD-10-CM | POA: Diagnosis not present

## 2016-06-10 ENCOUNTER — Encounter: Payer: Self-pay | Admitting: Physician Assistant

## 2016-06-21 ENCOUNTER — Encounter (INDEPENDENT_AMBULATORY_CARE_PROVIDER_SITE_OTHER): Payer: Self-pay

## 2016-06-21 ENCOUNTER — Encounter: Payer: Self-pay | Admitting: Physician Assistant

## 2016-06-21 ENCOUNTER — Other Ambulatory Visit: Payer: Self-pay

## 2016-06-21 ENCOUNTER — Other Ambulatory Visit (INDEPENDENT_AMBULATORY_CARE_PROVIDER_SITE_OTHER): Payer: BLUE CROSS/BLUE SHIELD

## 2016-06-21 ENCOUNTER — Ambulatory Visit (INDEPENDENT_AMBULATORY_CARE_PROVIDER_SITE_OTHER): Payer: BLUE CROSS/BLUE SHIELD | Admitting: Physician Assistant

## 2016-06-21 VITALS — BP 98/56 | HR 52 | Ht 68.0 in | Wt 170.0 lb

## 2016-06-21 DIAGNOSIS — R1013 Epigastric pain: Secondary | ICD-10-CM | POA: Diagnosis not present

## 2016-06-21 DIAGNOSIS — R634 Abnormal weight loss: Secondary | ICD-10-CM

## 2016-06-21 DIAGNOSIS — R197 Diarrhea, unspecified: Secondary | ICD-10-CM

## 2016-06-21 LAB — COMPREHENSIVE METABOLIC PANEL
ALT: 9 U/L (ref 0–35)
AST: 12 U/L (ref 0–37)
Albumin: 4.3 g/dL (ref 3.5–5.2)
Alkaline Phosphatase: 49 U/L (ref 39–117)
BUN: 13 mg/dL (ref 6–23)
CO2: 25 mEq/L (ref 19–32)
Calcium: 9 mg/dL (ref 8.4–10.5)
Chloride: 107 mEq/L (ref 96–112)
Creatinine, Ser: 0.64 mg/dL (ref 0.40–1.20)
GFR: 124.89 mL/min (ref >60.00)
Glucose, Bld: 88 mg/dL (ref 70–99)
Potassium: 4.5 mEq/L (ref 3.5–5.1)
Sodium: 139 mEq/L (ref 135–145)
Total Bilirubin: 0.5 mg/dL (ref 0.2–1.2)
Total Protein: 6.9 g/dL (ref 6.0–8.3)

## 2016-06-21 LAB — LIPASE: Lipase: 29 U/L (ref 11.0–59.0)

## 2016-06-21 LAB — CBC WITH DIFFERENTIAL/PLATELET
Basophils Absolute: 0 10*3/uL (ref 0.0–0.1)
Basophils Relative: 0.4 % (ref 0.0–3.0)
Eosinophils Absolute: 0.1 10*3/uL (ref 0.0–0.7)
Eosinophils Relative: 0.9 % (ref 0.0–5.0)
HCT: 38.1 % (ref 36.0–46.0)
Hemoglobin: 13.1 g/dL (ref 12.0–15.0)
Lymphocytes Relative: 27.4 % (ref 12.0–46.0)
Lymphs Abs: 2.2 10*3/uL (ref 0.7–4.0)
MCHC: 34.3 g/dL (ref 30.0–36.0)
MCV: 89.7 fl (ref 78.0–100.0)
Monocytes Absolute: 0.4 10*3/uL (ref 0.1–1.0)
Monocytes Relative: 4.8 % (ref 3.0–12.0)
Neutro Abs: 5.4 10*3/uL (ref 1.4–7.7)
Neutrophils Relative %: 66.5 % (ref 43.0–77.0)
Platelets: 255 10*3/uL (ref 150.0–400.0)
RBC: 4.25 Mil/uL (ref 3.87–5.11)
RDW: 12.9 % (ref 11.5–14.6)
WBC: 8.1 10*3/uL (ref 4.5–10.5)

## 2016-06-21 LAB — TSH: TSH: 1.28 u[IU]/mL (ref 0.35–5.50)

## 2016-06-21 MED ORDER — ONDANSETRON 4 MG PO TBDP: 4.0000 mg | ORAL_TABLET | Freq: Four times a day (QID) | ORAL | 1 refills | Status: DC | PRN

## 2016-06-21 NOTE — Patient Instructions (Addendum)
If you are age 20 or older, your body mass index should be between 23-30. Your Body mass index is 25.85 kg/m. If this is out of the aforementioned range listed, please consider follow up with your Primary Care Provider.  If you are age 20 or younger, your body mass index should be between 19-25. Your Body mass index is 25.85 kg/m. If this is out of the aformentioned range listed, please consider follow up with your Primary Care Provider.   You have been scheduled for an endoscopy. Please follow written instructions given to you at your visit today. If you use inhalers (even only as needed), please bring them with you on the day of your procedure. Your physician has requested that you go to www.startemmi.com and enter the access code given to you at your visit today. This web site gives a general overview about your procedure. However, you should still follow specific instructions given to you by our office regarding your preparation for the procedure.  We have sent the following medications to your pharmacy for you to pick up at your convenience: Zofran  Thank you for choosing [facility]

## 2016-06-21 NOTE — Progress Notes (Addendum)
Chief Complaint: Vomiting, Nausea, Abdominal pain  HPI:  Kathy Cole is a 20 year old female with a history of anxiety and depression, who presents to clinic today as a new patient with a complaint of dyspepsia, diarrhea, weight loss and epigastric discomfort.   Today, the patient tells me that her whole life she has had "digestion issues". She tells me that she can feel her food digest in the different parts of her stomach and intestines and this is very uncomfortable for her. She has been to see various doctors in urgent care over the past 2-3 years, but most recently in November she started with episodes of vomiting 1-3 times per week somewhat "out of the blue". She also has constant nausea which she has been treating by smoking marijuana on a daily basis. She notes that every morning she wakes up and feels nauseous and has a lot of gas and will eventually have a bowel movement, this is associated with a lot of abdominal discomfort. She tells me that sometimes after a bowel movement she feels better and other times she doesn't. She describes that eating anything tends to make her epigastric discomfort worse, she feels more nausea when her "stomach empties". She tells me she has been avoiding lactose and is "basically gluten-free", she has noticed that these foods tend to bother her more than others. Patient also tells me that the amount of food seems to matter. If she eats a small meal or just drinks a shake sometime she is okay, as compared to eating a large bowl of pasta or something else. She tells me that she can vomit any time up to 4 hours after eating. Sometimes she drinks a lot of water when she feels nauseous and has abdominal discomfort, but will sometimes just vomit all of this water up "violently". The patient has noticed a 5 pound weight loss over the past 2-3 weeks and this is "very abnormal for her". Occasionally this abdominal discomfort will even awaken her from sleep. Patient does try to  remember to take a daily probiotic, though occasionally forgets. She tells me she takes this with dinner and it makes her feel somewhat better.   The patient also tells me that typically her stools are loose, though occasionally she will have formed ones.   Patient's medical history is positive for anxiety for which she is on medication. Patient also tells me that she stays cold " wonders if there is something wrong with my thyroid".   Patient denies fever, chills, blood in her stool, melena, anorexia, early satiety, heartburn, reflux, dysphagia, change in medication or diet.  Past Medical History:  Diagnosis Date  . Anxiety   . Depression     Past Surgical History:  Procedure Laterality Date  . GANGLION CYST EXCISION      Current Outpatient Prescriptions  Medication Sig Dispense Refill  . clonazePAM (KLONOPIN) 0.5 MG tablet Take 0.5 mg by mouth as needed for anxiety.    . Levonorgestrel (SKYLA) 13.5 MG IUD by Intrauterine route.    . lurasidone (LATUDA) 40 MG TABS tablet Take 40 mg by mouth daily with breakfast.     No current facility-administered medications for this visit.     Allergies as of 06/21/2016  . (No Known Allergies)    Family History  Problem Relation Age of Onset  . Colon polyps Father     Social History   Social History  . Marital status: Single    Spouse name: N/A  . Number  of children: N/A  . Years of education: N/A   Occupational History  . Student    Social History Main Topics  . Smoking status: Former Games developermoker  . Smokeless tobacco: Never Used  . Alcohol use 0.0 oz/week     Comment: 3 drinks liquor at a party monthly  . Drug use:     Types: Marijuana     Comment: THC once month 1/8th gram  . Sexual activity: Yes    Birth control/ protection: IUD   Other Topics Concern  . Not on file   Social History Narrative  . No narrative on file    Review of Systems:     Constitutional: No weight loss, fever, chills, weakness or fatigue HEENT:  Eyes: No change in vision               Ears, Nose, Throat:  No change in hearing Skin: No rash  Cardiovascular: No chest pain Respiratory: No SOB Gastrointestinal: See HPI and otherwise negative Neurological: No headache or dizziness Musculoskeletal: No new muscle or joint pain Hematologic: No bleeding  Psychiatric: Positive for depression and anxiety    Physical Exam:  Vital signs: BP (!) 98/56   Pulse (!) 52   Ht 5\' 8"  (1.727 m)   Wt 170 lb (77.1 kg)   BMI 25.85 kg/m   Constitutional:   Pleasant Caucasian female appears to be in NAD, Well developed, Well nourished, alert and cooperative Head:  Normocephalic and atraumatic. Eyes:   PEERL, EOMI. No icterus. Conjunctiva pink. Ears:  Normal auditory acuity. Neck:  Supple Throat: Oral cavity and pharynx without inflammation, swelling or lesion.  Respiratory: Respirations even and unlabored. Lungs clear to auscultation bilaterally.   No wheezes, crackles, or rhonchi.  Cardiovascular: Normal S1, S2. No MRG. Regular rate and rhythm. No peripheral edema, cyanosis or pallor.  Gastrointestinal:  Soft, nondistended, nontender. No rebound or guarding. Hyperactive bowel sounds. No appreciable masses or hepatomegaly. Rectal:  Not performed.  Msk:  Symmetrical without gross deformities. Without edema, no deformity or joint abnormality.  Neurologic:  Alert and  oriented x4;  grossly normal neurologically.  Skin:   Dry and intact without significant lesions or rashes. Psychiatric: Demonstrates good judgement and reason without abnormal affect or behaviors.  MOST RECENT LABS:  CBC    Component Value Date/Time   WBC 8.7 02/15/2016 1619   RBC 3.96 02/15/2016 1619   HGB 12.2 02/15/2016 1619   HCT 35.2 (L) 02/15/2016 1619   PLT 220 02/15/2016 1619   MCV 88.9 02/15/2016 1619   MCV 88.3 11/17/2015 0952   MCH 30.8 02/15/2016 1619   MCHC 34.7 02/15/2016 1619   RDW 12.8 02/15/2016 1619    CMP     Component Value Date/Time   NA 137  02/15/2016 1619   K 3.8 02/15/2016 1619   CL 108 02/15/2016 1619   CO2 22 02/15/2016 1619   GLUCOSE 80 02/15/2016 1619   BUN 12 02/15/2016 1619   CREATININE 0.50 02/15/2016 1619   CREATININE 0.68 11/17/2015 0941   CALCIUM 8.7 (L) 02/15/2016 1619   PROT 7.0 02/15/2016 1619   ALBUMIN 4.5 02/15/2016 1619   AST 17 02/15/2016 1619   ALT 12 (L) 02/15/2016 1619   ALKPHOS 45 02/15/2016 1619   BILITOT 0.5 02/15/2016 1619   GFRNONAA >60 02/15/2016 1619   GFRNONAA >89 11/17/2015 0941   GFRAA >60 02/15/2016 1619   GFRAA >89 11/17/2015 0941    Assessment: 1. Dyspepsia: Patient describes constant nausea and 1-3 episodes  of vomiting per week, typically  These episodes are in the morning or they can be up to 4 hours after eating, this has been occurring for the past 2 months; no reflux was somewhat better with smaller meals; question celiac disease versus H. pylori versus gastritis versus gastroparesis versus IBS 2. Epigastric pain: See above 3. Diarrhea: Patient tells me that most of her stools are loose, though she does have some solid, this has been for her whole life; consider IBS most likely 4. Weight loss: Patient reports a 5 pound weight loss which is "very unusual for her" over the past 3-4 weeks; most likely due to nausea and vomiting  Plan: 1. Recommend the patient proceed with an EGD for further investigation of her dyspepsia and abdominal discomfort. Discussed risks, benefits, limitations and alternatives and the patient agrees to proceed. Discussed that at time of EGD biopsies will be done for possible celiac disease and H. pylori bacteria. This waS scheduled with Dr. Myrtie Neither as the patient requests a specific day and time and he had availability. This is scheduled in the [facility]. 2. Prescribed Zofran 4 mg every 6 hours as needed for nausea #30 with 1 refill 3. Encouraged the patient to decrease her marijuana intake as this can also cause GI upset. 4. Ordered labs include CBC, CMP, lipase and  TSH 5. Discussed the possibility of irritable bowel syndrome with the patient. Recommend she use a daily probiotic such as Align for the next 1-2 months. Also discussed that this can be related to anxiety and stress level. 6. Patient to return to clinic per Dr. Irving Burton recommendations after time of procedure.  Hyacinth Meeker, PA-C [facility] 06/21/2016, 9:32 AM  Cc: No ref. provider found    Thank you for sending this case to me. I have reviewed the entire note, and the outlined plan seems appropriate.  Will take biopsies as noted. This case has many clinical indicators of a functional bowel disorder.

## 2016-07-18 ENCOUNTER — Ambulatory Visit (AMBULATORY_SURGERY_CENTER): Payer: BLUE CROSS/BLUE SHIELD | Admitting: Gastroenterology

## 2016-07-18 ENCOUNTER — Encounter: Payer: Self-pay | Admitting: Gastroenterology

## 2016-07-18 ENCOUNTER — Other Ambulatory Visit: Payer: Self-pay | Admitting: Gastroenterology

## 2016-07-18 VITALS — BP 128/99 | HR 62 | Temp 99.3°F | Resp 14 | Ht 68.0 in | Wt 170.0 lb

## 2016-07-18 DIAGNOSIS — K319 Disease of stomach and duodenum, unspecified: Secondary | ICD-10-CM

## 2016-07-18 DIAGNOSIS — K2950 Unspecified chronic gastritis without bleeding: Secondary | ICD-10-CM | POA: Diagnosis not present

## 2016-07-18 DIAGNOSIS — R1013 Epigastric pain: Secondary | ICD-10-CM | POA: Diagnosis present

## 2016-07-18 DIAGNOSIS — R112 Nausea with vomiting, unspecified: Secondary | ICD-10-CM

## 2016-07-18 DIAGNOSIS — R1084 Generalized abdominal pain: Secondary | ICD-10-CM

## 2016-07-18 DIAGNOSIS — R197 Diarrhea, unspecified: Secondary | ICD-10-CM

## 2016-07-18 DIAGNOSIS — R109 Unspecified abdominal pain: Secondary | ICD-10-CM | POA: Diagnosis not present

## 2016-07-18 DIAGNOSIS — R1115 Cyclical vomiting syndrome unrelated to migraine: Secondary | ICD-10-CM

## 2016-07-18 DIAGNOSIS — K2960 Other gastritis without bleeding: Secondary | ICD-10-CM

## 2016-07-18 MED ORDER — SODIUM CHLORIDE 0.9 % IV SOLN: 500.0000 mL | INTRAVENOUS | Status: DC

## 2016-07-18 MED ORDER — PROCHLORPERAZINE MALEATE 10 MG PO TABS: 10.0000 mg | ORAL_TABLET | Freq: Three times a day (TID) | ORAL | 1 refills | Status: DC | PRN

## 2016-07-18 NOTE — Progress Notes (Signed)
To PACU, vss patent aw report to rn 

## 2016-07-18 NOTE — Patient Instructions (Signed)
YOU HAD AN ENDOSCOPIC PROCEDURE TODAY AT THE [facility]:   Refer to the procedure report that was given to you for any specific questions about what was found during the examination.  If the procedure report does not answer your questions, please call your gastroenterologist to clarify.  If you requested that your care partner not be given the details of your procedure findings, then the procedure report has been included in a sealed envelope for you to review at your convenience later.  YOU SHOULD EXPECT: Some feelings of bloating in the abdomen. Passage of more gas than usual.  Walking can help get rid of the air that was put into your GI tract during the procedure and reduce the bloating. If you had a lower endoscopy (such as a colonoscopy or flexible sigmoidoscopy) you may notice spotting of blood in your stool or on the toilet paper. If you underwent a bowel prep for your procedure, you may not have a normal bowel movement for a few days.  Please Note:  You might notice some irritation and congestion in your nose or some drainage.  This is from the oxygen used during your procedure.  There is no need for concern and it should clear up in a day or so.  SYMPTOMS TO REPORT IMMEDIATELY:   Following upper endoscopy (EGD)  Vomiting of blood or coffee ground material  New chest pain or pain under the shoulder blades  Painful or persistently difficult swallowing  New shortness of breath  Fever of 100F or higher  Black, tarry-looking stools  For urgent or emergent issues, a gastroenterologist can be reached at any hour by calling [phone].  Please read all handouts given to you by your recovery nurse today.   DIET:  We do recommend a small meal at first, but then you may proceed to your regular diet.  Drink plenty of fluids but you should avoid alcoholic beverages for 24 hours.  ACTIVITY:  You should plan to take it easy for the rest of today and you should NOT DRIVE or use  heavy machinery until tomorrow (because of the sedation medicines used during the test).    FOLLOW UP: Our staff will call the number listed on your records the next business day following your procedure to check on you and address any questions or concerns that you may have regarding the information given to you following your procedure. If we do not reach you, we will leave a message.  However, if you are feeling well and you are not experiencing any problems, there is no need to return our call.  We will assume that you have returned to your regular daily activities without incident.  If any biopsies were taken you will be contacted by phone or by letter within the next 1-3 weeks.  Please call us at [phone] if you have not heard about the biopsies in 3 weeks.    SIGNATURES/CONFIDENTIALITY: You and/or your care partner have signed paperwork which will be entered into your electronic medical record.  These signatures attest to the fact that that the information above on your After Visit Summary has been reviewed and is understood.  Full responsibility of the confidentiality of this discharge information lies with you and/or your care-partner.  Thank you for letting us take care of your healthcare needs today.

## 2016-07-18 NOTE — Progress Notes (Signed)
Called to room to assist during endoscopic procedure.  Patient ID and intended procedure confirmed with present staff. Received instructions for my participation in the procedure from the performing physician.  

## 2016-07-18 NOTE — Op Note (Signed)
[facility] Patient Name: Kathy Cole Procedure Date: 07/18/2016 9:31 AM MRN: [id number] Endoscopist: Sherilyn Cooter L. Myrtie Neither , MD Age: 21 Referring MD:  Date of Birth: February 21, 1996 Gender: Female Account #: [id number] Procedure:                Upper GI endoscopy Indications:              Indigestion, Abdominal bloating, Diarrhea, Nausea                            with vomiting, Weight loss Medicines:                Monitored Anesthesia Care Procedure:                Pre-Anesthesia Assessment:                           - Prior to the procedure, a History and Physical                            was performed, and patient medications and                            allergies were reviewed. The patient's tolerance of                            previous anesthesia was also reviewed. The risks                            and benefits of the procedure and the sedation                            options and risks were discussed with the patient.                            All questions were answered, and informed consent                            was obtained. Prior Anticoagulants: The patient has                            taken no previous anticoagulant or antiplatelet                            agents. ASA Grade Assessment: II - A patient with                            mild systemic disease. After reviewing the risks                            and benefits, the patient was deemed in                            satisfactory condition to undergo the procedure.  After obtaining informed consent, the endoscope was                            passed under direct vision. Throughout the                            procedure, the patient's blood pressure, pulse, and                            oxygen saturations were monitored continuously. The                            Model GIF-[id number]) scope was introduced                            through the mouth, and advanced  to the second part                            of duodenum. The upper GI endoscopy was                            accomplished without difficulty. The patient                            tolerated the procedure well. Scope In: Scope Out: Findings:                 The esophagus was normal.                           A few, non-bleeding erosions were found in the                            stomach. There were no stigmata of recent bleeding.                            Several biopsies were obtained in the gastric body                            and in the gastric antrum with cold forceps for                            histology.                           The cardia and gastric fundus were normal on                            retroflexion.                           The examined duodenum was normal. Biopsies for                            histology were taken with a cold forceps for  evaluation of celiac disease. Complications:            No immediate complications. Estimated Blood Loss:     Estimated blood loss: none. Impression:               - Normal esophagus.                           - Non-bleeding erosive gastropathy.                           - Normal examined duodenum. Biopsied.                           - Several biopsies were obtained in the gastric                            body and in the gastric antrum. Recommendation:           - Patient has a contact number available for                            emergencies. The signs and symptoms of potential                            delayed complications were discussed with the                            patient. Return to normal activities tomorrow.                            Written discharge instructions were provided to the                            patient.                           - Resume previous diet.                           - Continue present medications.                           - Await  pathology results.                           - Return to physician assistant in 4 weeks. [name] L. Myrtie Neither, MD 07/18/2016 9:52:54 AM This report has been signed electronically.

## 2016-07-18 NOTE — Progress Notes (Signed)
Patient is requesting a copy of report for her grandfather, a [place]iologist [place].  Patient has a nose ring, unable to remove without pliers. Patient stating she last ate at 2355 last evening. Patient stating she did vomit this am, some "tiny amount of food, "a kernel of popcorn" in vomitus. Patient stating she does have problem with undigested food in vomitus.

## 2016-07-19 ENCOUNTER — Telehealth: Payer: Self-pay | Admitting: *Deleted

## 2016-07-19 NOTE — Telephone Encounter (Signed)
  Follow up Call-  Call back number 07/18/2016  Post procedure Call Back phone  # [phone]  Permission to leave phone message Yes     Patient questions:  Do you have a fever, pain , or abdominal swelling? No. Pain Score  0 *  Have you tolerated food without any problems? Yes.    Have you been able to return to your normal activities? Yes.    Do you have any questions about your discharge instructions: Diet   No. Medications  No. Follow up visit  No.  Do you have questions or concerns about your Care? No.  Actions: * If pain score is 4 or above: No action needed, pain <4.

## 2016-07-22 ENCOUNTER — Encounter: Payer: Self-pay | Admitting: Gastroenterology

## 2016-08-01 DIAGNOSIS — F3132 Bipolar disorder, current episode depressed, moderate: Secondary | ICD-10-CM | POA: Diagnosis not present

## 2016-08-01 DIAGNOSIS — F411 Generalized anxiety disorder: Secondary | ICD-10-CM | POA: Diagnosis not present

## 2016-08-04 ENCOUNTER — Telehealth: Payer: Self-pay | Admitting: Gastroenterology

## 2016-08-04 NOTE — Telephone Encounter (Signed)
Patient has not received result letter yet, read her the letter. She is questioning why she continues to have symptoms despite all the negative test results. Let patient know that Dr. Myrtie Neitheranis is out of the office and that it would be next week before I could get her an answer as to next step.Please advise.

## 2016-08-07 NOTE — Telephone Encounter (Signed)
IBS  Dicyclomine 10 mg 2-3 times daily before meals. Disp #45, RF 2  See Victorino DikeJennifer next available.

## 2016-08-08 ENCOUNTER — Other Ambulatory Visit: Payer: Self-pay

## 2016-08-08 ENCOUNTER — Telehealth: Payer: Self-pay | Admitting: Gastroenterology

## 2016-08-08 MED ORDER — DICYCLOMINE HCL 10 MG PO CAPS: 10.0000 mg | ORAL_CAPSULE | ORAL | 2 refills | Status: DC

## 2016-08-08 NOTE — Telephone Encounter (Signed)
A user error has taken place: ERROR °

## 2016-08-08 NOTE — Telephone Encounter (Signed)
Spoke to patient, she will try the medication. She would prefer to try the medication for a longer period of time and then follow up with Dr. Myrtie Neitheranis on 2/8.

## 2016-08-18 ENCOUNTER — Encounter: Payer: Self-pay | Admitting: Gastroenterology

## 2016-08-18 ENCOUNTER — Ambulatory Visit (INDEPENDENT_AMBULATORY_CARE_PROVIDER_SITE_OTHER): Payer: BLUE CROSS/BLUE SHIELD | Admitting: Gastroenterology

## 2016-08-18 VITALS — BP 120/56 | HR 60 | Ht 68.0 in | Wt 162.4 lb

## 2016-08-18 DIAGNOSIS — K580 Irritable bowel syndrome with diarrhea: Secondary | ICD-10-CM | POA: Diagnosis not present

## 2016-08-18 DIAGNOSIS — R1115 Cyclical vomiting syndrome unrelated to migraine: Secondary | ICD-10-CM

## 2016-08-18 DIAGNOSIS — G43A0 Cyclical vomiting, not intractable: Secondary | ICD-10-CM

## 2016-08-18 MED ORDER — ONDANSETRON 4 MG PO TBDP: 4.0000 mg | ORAL_TABLET | Freq: Four times a day (QID) | ORAL | 3 refills | Status: DC | PRN

## 2016-08-18 MED ORDER — DICYCLOMINE HCL 10 MG PO CAPS: 10.0000 mg | ORAL_CAPSULE | ORAL | 2 refills | Status: DC

## 2016-08-18 NOTE — Patient Instructions (Signed)
If you are age 21 or older, your body mass index should be between 23-30. Your Body mass index is 24.69 kg/m. If this is out of the aforementioned range listed, please consider follow up with your Primary Care Provider.  If you are age 264 or younger, your body mass index should be between 19-25. Your Body mass index is 24.69 kg/m. If this is out of the aformentioned range listed, please consider follow up with your Primary Care Provider.   Please refer to the Primary Care List that has been given to you to establish care.  Thank you for choosing [facility]  Dr Amada JupiterHenry Danis III

## 2016-08-18 NOTE — Progress Notes (Signed)
[facility] Progress Note  Chief Complaint: Vomiting abdominal pain diarrhea  Subjective  History:  This is a 21 year old woman seen by our PA 2 months ago for a constellation of symptoms. She was having frequent vomiting but also using marijuana daily. There was some intermittent diarrhea as well. Upper endoscopy a few weeks ago showed a few scattered erosions in the stomach but was otherwise normal. She is somewhat fixated on the findings of "severe inflammation" in once to know that she should do for medicines her diet. She was initially prescribed Zofran which she says helped the nausea that made her dizzy so that she could not go to school. I then prescribed Compazine, but she says it did not work and is now requesting Zofran again. I prescribed dicyclomine which has apparently helped reduce abdominal cramps and diarrhea and vomiting. She does not currently have primary care, in one to know if I would assume care for her fibromyalgia since she she has read that those 2 conditions are related. She is bothered by profound fatigue and myalgias.  ROS: Cardiovascular:  no chest pain Respiratory: no dyspnea  The patient's Past Medical, Family and Social History were reviewed and are on file in the EMR.  Objective:  Med list reviewed  Vital signs in last 24 hrs: Vitals:   08/18/16 1556  BP: (!) 120/56  Pulse: 60    Physical Exam  Pleasant young woman, almost tearful at times.  HEENT: sclera anicteric, oral mucosa moist without lesions. No muscle wasting  Neck: supple, no thyromegaly, JVD or lymphadenopathy  Cardiac: RRR without murmurs, S1S2 heard, no peripheral edema  Pulm: clear to auscultation bilaterally, normal RR and effort noted  Abdomen: soft, epigastric tenderness, with active bowel sounds. No guarding or palpable hepatosplenomegaly.  Skin; warm and dry, no jaundice or rash  Recent Labs:  Small bowel and gastric biopsies  normal    @ASSESSMENTPLANBEGIN @ Assessment: Encounter Diagnoses  Name Primary?  . Non-intractable cyclical vomiting with nausea Yes  . Irritable bowel syndrome with diarrhea     Ailed has a functional bowel disorder, perhaps related to underlying acid active or chronic pain disorder. We have given her the names and contact information available about her primary care practices that are accepting new patients. I refilled her Zofran and dicyclomine and gave her a note to excuse her from recent school absences. I do not have any further testing planned.  Total time 15 minutes, over half spent in counseling and coordination of care.   Charlie PitterHenry L Danis III

## 2016-08-24 ENCOUNTER — Encounter: Payer: Self-pay | Admitting: Family Medicine

## 2016-08-24 ENCOUNTER — Ambulatory Visit (INDEPENDENT_AMBULATORY_CARE_PROVIDER_SITE_OTHER): Payer: BLUE CROSS/BLUE SHIELD | Admitting: Family Medicine

## 2016-08-24 VITALS — BP 112/60 | HR 59 | Temp 98.4°F | Resp 18 | Ht 68.0 in | Wt 164.0 lb

## 2016-08-24 DIAGNOSIS — G729 Myopathy, unspecified: Secondary | ICD-10-CM | POA: Diagnosis not present

## 2016-08-24 DIAGNOSIS — K580 Irritable bowel syndrome with diarrhea: Secondary | ICD-10-CM | POA: Diagnosis not present

## 2016-08-24 DIAGNOSIS — F3130 Bipolar disorder, current episode depressed, mild or moderate severity, unspecified: Secondary | ICD-10-CM | POA: Diagnosis not present

## 2016-08-24 DIAGNOSIS — M6281 Muscle weakness (generalized): Secondary | ICD-10-CM | POA: Diagnosis not present

## 2016-08-24 NOTE — Progress Notes (Signed)
Chief Complaint  Patient presents with  . Establish Care  . Irritable Bowel Syndrome  . Fibromyalgia    HPI   She was recently diagnosed with IBS and was concerned that she may have fibromyalgia as well IBS She was diagnosed with IBS after vomiting 3 times a week for 4 months She reports that she is on Bentyl and Zofran now which is helping. She denies getting fatigue after taking Bentyl and Zofran which are known to cause drowsiness.   She has widespread chronic pain that is intermittent The pain seems to focus in her shoulder and lower back and radiates outwards She states that she gets it in the upper arms and legs She also has chronic fatigue and usually goes to bed at 8pm and even misses her Bipolar Depression medications  Bipolar Disorder She reports that her bipolar disorder has been stable  She states that she sees Psychiatry and is on JordanLatuda  She was tested for ADHD due to experiencing "brain fog" for this past year and was not diagnosed with ADHD.  She also has cold sensitivity as well so she was checked for thyroid disorder which was normal.  Lab Results  Component Value Date   TSH 1.28 06/21/2016     Past Medical History:  Diagnosis Date  . Anxiety   . Depression   . Erosive gastropathy   . IBS (irritable bowel syndrome)     Current Outpatient Prescriptions  Medication Sig Dispense Refill  . clonazePAM (KLONOPIN) 0.5 MG tablet Take 0.5 mg by mouth as needed for anxiety.    . dicyclomine (BENTYL) 10 MG capsule Take 1 capsule (10 mg total) by mouth as directed. 2-3 times daily before meals 45 capsule 2  . Levonorgestrel (SKYLA) 13.5 MG IUD by Intrauterine route.    . lurasidone (LATUDA) 40 MG TABS tablet Take 40 mg by mouth daily with breakfast.    . Multiple Vitamin (MULTI VITAMIN DAILY PO) Take by mouth.    . ondansetron (ZOFRAN ODT) 4 MG disintegrating tablet Take 1 tablet (4 mg total) by mouth every 6 (six) hours as needed for nausea or vomiting. 45  tablet 3  . Probiotic Product (PROBIOTIC-10 PO) Take by mouth.     Current Facility-Administered Medications  Medication Dose Route Frequency Provider Last Rate Last Dose  . 0.9 %  sodium chloride infusion  500 mL Intravenous Continuous Charlie PitterHenry L Danis III, MD        Allergies: No Known Allergies  Past Surgical History:  Procedure Laterality Date  . GANGLION CYST EXCISION      Social History   Social History  . Marital status: Single    Spouse name: N/A  . Number of children: N/A  . Years of education: N/A   Occupational History  . Student    Social History Main Topics  . Smoking status: Current Every Day Smoker    Packs/day: 0.10    Years: 1.00  . Smokeless tobacco: Never Used     Comment: also has an E cigarette  . Alcohol use 0.0 oz/week     Comment: 3 drinks liquor at a party monthly  . Drug use: Yes    Types: Marijuana     Comment: THC once month 1/8th gram  . Sexual activity: Yes    Birth control/ protection: IUD   Other Topics Concern  . None   Social History Narrative  . None    Review of Systems  Constitutional: Negative for chills and fever.  Respiratory: Negative for cough, shortness of breath and wheezing.   Cardiovascular: Negative for chest pain and palpitations.  Gastrointestinal: Positive for abdominal pain and diarrhea. Negative for constipation, nausea and vomiting.  Musculoskeletal: Positive for back pain, joint pain and myalgias.  Skin: Negative for itching and rash.  Endo/Heme/Allergies:       Cold intolerance  Psychiatric/Behavioral: Positive for depression. Negative for substance abuse and suicidal ideas. The patient is not nervous/anxious.     Objective: Vitals:   08/24/16 0914  BP: 112/60  Pulse: (!) 59  Resp: 18  Temp: 98.4 F (36.9 C)  TempSrc: Oral  SpO2: 98%  Weight: 164 lb (74.4 kg)  Height: 5\' 8"  (1.727 m)    Physical Exam  Constitutional: She is oriented to person, place, and time. She appears well-developed and  well-nourished.  HENT:  Head: Normocephalic and atraumatic.  Right Ear: External ear normal.  Left Ear: External ear normal.  Nose: Nose normal.  Mouth/Throat: Oropharynx is clear and moist.  Eyes: Conjunctivae and EOM are normal.  Cardiovascular: Normal rate, regular rhythm and normal heart sounds.   No murmur heard. Pulmonary/Chest: Effort normal and breath sounds normal. No respiratory distress. She has no wheezes.  Musculoskeletal: Normal range of motion. She exhibits no edema or deformity.  Neurological: She is oriented to person, place, and time.  Psychiatric: She has a normal mood and affect. Her behavior is normal. Judgment and thought content normal.    Assessment and Plan Caelen was seen today for establish care, irritable bowel syndrome and fibromyalgia.  Diagnoses and all orders for this visit:  Myopathy Proximal muscle weakness - discussed with the patient that she should get a more detailed evaluation  Discussed that with her proximal muscle weakness there are other inflammatory disorders that could be the cause -     Ambulatory referral to Rheumatology  Bipolar affective disorder, current episode depressed, current episode severity unspecified (HCC)- stable, continue with Psychiatry  Irritable bowel syndrome with diarrhea- improved with bentyl and zofran      Kathy Cole

## 2016-08-24 NOTE — Patient Instructions (Addendum)
IF you received an x-ray today, you will receive an invoice from [facility]. Please contact [facility] at [phone] with questions or concerns regarding your invoice.   IF you received labwork today, you will receive an invoice from [facility]. Please contact [facility] at [phone] with questions or concerns regarding your invoice.   Our billing staff will not be able to assist you with questions regarding bills from these companies.  You will be contacted with the lab results as soon as they are available. The fastest way to get your results is to activate your My Chart account. Instructions are located on the last page of this paperwork. If you have not heard from us regarding the results in 2 weeks, please contact this office.     Myopathy Introduction Myopathy is a condition that causes muscles to be weak and dysfunctional. There are many different kinds of myopathies. Myopathies may be passed from parent to child (inherited), or they may be caused by external factors (acquired). Inherited myopathies may cause symptoms at birth or in early life. Acquired myopathies may start suddenly at any age. There is no cure for most myopathies. What are the causes? Causes of myopathy include:  Endocrine disorders, such as thyroid disease.  Metabolic disorders, which are usually inherited.  Infection or inflammation of the muscles. This is often triggered by viruses or because the body's defense system (immune system) is attacking the muscles.  Certain medicines, such as lipid-lowering medicines. In some cases, the cause is not known. What increases the risk? This condition is more likely to develop in:  People who have a family history of myopathy.  Women. What are the signs or symptoms? Symptoms of myopathy can range from mild to severe. The most common symptoms include muscle weakness, cramps, stiffness, and spasms. These symptoms are usually felt close to the  center of the body (proximal). Depending upon the type of myopathy, one muscle group may be more affected than others. In inherited myopathies, symptoms vary among family members. Other symptoms of myopathy include:  Muscle pain or tenderness.  Muscle weakness that gets progressively worse.  Fatigue.  Heart problems.  Trouble breathing.  Trouble swallowing. How is this diagnosed? This condition is diagnosed based on your medical history and tests, which may include:  Blood tests.  Removal of a small piece of muscle tissue to be tested (biopsy).  Electromyogram (EMG).  MRI.  Electrocardiogram (ECG).  Genetic testing. How is this treated? Treatment varies depending on the type of myopathy. Treatment may include over-the-counter or prescription medicines, such as disease-modifying antirheumatic drugs (DMARDs) or drugs that suppress the immune system. You may need physical therapy, a brace to stabilize your muscles, or surgery. Follow these instructions at home: If you have a brace:  Wear the brace as told by your health care provider. Remove it only as told by your health care provider.  Loosen the brace if any part of your body tingles, becomes numb, or turns cold and blue.  Do not let your brace get wet if it is not waterproof. If it is not waterproof, cover it with a watertight plastic bag when you take a bath or a shower.  Keep the brace clean.  Ask your health care provider when it is safe to drive if you are wearing a brace. General instructions  Take over-the-counter and prescription medicines only as told by your health care provider.  Maintain a healthy weight. Follow instructions from your health care provider about  eating, drinking, and physical activity.  If physical therapy is prescribed, do exercises as told by your health care provider or physical therapist.  Keep all follow-up visits as told by your health care provider. This is important. Contact a  health care provider if:  You have trouble managing your symptoms at home.  You have a fever. Get help right away if:  You develop breathing problems.  You develop chest pain. This information is not intended to replace advice given to you by your health care provider. Make sure you discuss any questions you have with your health care provider. Document Released: 06/17/2002 Document Revised: 12/03/2015 Document Reviewed: 01/07/2015  2017 Elsevier

## 2016-08-31 ENCOUNTER — Ambulatory Visit: Payer: BLUE CROSS/BLUE SHIELD | Admitting: Family Medicine

## 2016-09-02 DIAGNOSIS — R5382 Chronic fatigue, unspecified: Secondary | ICD-10-CM | POA: Diagnosis not present

## 2016-09-02 DIAGNOSIS — M791 Myalgia: Secondary | ICD-10-CM | POA: Diagnosis not present

## 2016-09-07 DIAGNOSIS — F411 Generalized anxiety disorder: Secondary | ICD-10-CM | POA: Diagnosis not present

## 2016-09-07 DIAGNOSIS — F3132 Bipolar disorder, current episode depressed, moderate: Secondary | ICD-10-CM | POA: Diagnosis not present

## 2016-10-03 DIAGNOSIS — F3132 Bipolar disorder, current episode depressed, moderate: Secondary | ICD-10-CM | POA: Diagnosis not present

## 2016-10-03 DIAGNOSIS — F411 Generalized anxiety disorder: Secondary | ICD-10-CM | POA: Diagnosis not present

## 2016-11-24 ENCOUNTER — Ambulatory Visit (INDEPENDENT_AMBULATORY_CARE_PROVIDER_SITE_OTHER): Payer: BLUE CROSS/BLUE SHIELD | Admitting: Family Medicine

## 2016-11-24 ENCOUNTER — Encounter: Payer: Self-pay | Admitting: Family Medicine

## 2016-11-24 DIAGNOSIS — R5383 Other fatigue: Secondary | ICD-10-CM

## 2016-11-24 DIAGNOSIS — M791 Myalgia, unspecified site: Secondary | ICD-10-CM

## 2016-11-24 NOTE — Patient Instructions (Addendum)
It was very good to see you today.  We are testing you for Lyme, thyroid issues, and rheumatologic blood tests as we discussed.    These should be back in the next several days, which means early next week.  I'll let you know the results and interpret them for you as they come back.      IF you received an x-ray today, you will receive an invoice from [facility]. Please contact [facility] at [phone] with questions or concerns regarding your invoice.   IF you received labwork today, you will receive an invoice from [facility]. Please contact [facility] at [phone] with questions or concerns regarding your invoice.   Our billing staff will not be able to assist you with questions regarding bills from these companies.  You will be contacted with the lab results as soon as they are available. The fastest way to get your results is to activate your My Chart account. Instructions are located on the last page of this paperwork. If you have not heard from us regarding the results in 2 weeks, please contact this office.

## 2016-11-24 NOTE — Progress Notes (Signed)
Kathy Cole is a 21 y.o. female who [facility] today for fatigue:  1.  Fatigue and myalgias:  Ongoing issue for patient for the past 6 months.  Seen here 08/24/16 with similar symptoms.     Past medical history:  The patient had an episode of rash and fever 2 years ago (2016).  Had positive RMSF titers at that time and diagnosed as RMSF.  Treated with doxycycline.  Did okay since that time until about 8 months ago when began having GI distress and diagnosed as IBS.  States that around October of last year, she began having increasing myalgias and fatigue.  [place] to [place], but had already been having those symptoms.  Did not travel anywhere during the summer/months leading up to around October.    She describes myalgias in mostly proximal muscle distribution (shoulders, hips) but sometimes with vague aches in peripheral muscles.  Also some neck stiffness.  Some frontal headaches for past 2 months, describes as "ache" here as well.  There are days she spends the entire day in bed and has notably increased fatigue/sleepiness, which she states is not her norm.  Occurs 2 - 5 times per week.  On her other days, she attempts to work out and maintain good level of fitness.  States she does not overdo it when working out, and that her aches are not typical muscle soreness.  Also with rigors and chills some mornings.  No real fevers, just chills and shaking.  No night sweats.  No cough.  No travel in past year out of the country.  No exposures to people with similar symptoms.  She has reportedly lost 20 lbs since last August, mostly due to ongoing nausea and anorexia from her nausea.  Does occasionally have dry heaves and some vomiting, especially in the AM if she eats too late at night.    She has a history of abnormal TSH in past -- initially high [facility], then rechecked and found to be low with alternating T4.  She was sent to  rheumatologist.  Ran series of blood tests there which reportedly all returned negative.  She has not seen and does not have the results of these tests.    She is requesting to be tested for Lyme disease based on her prior RMSF diagnosis.  She never saw tick bite with RMSF. No target lesions.  No chest pain, dyspnea, LE edema, rashes.  Does have some depressed symptoms, which are her baseline.  Has had admissions to behavioral health for psych issues and diagnosis of Bipolar disorder.    Fam History:  No family history of rheumatologic disease  ROS as above.    PMH reviewed. Patient is a nonsmoker.   Past Medical History:  Diagnosis Date  . Anxiety   . Depression   . Erosive gastropathy   . IBS (irritable bowel syndrome)    Past Surgical History:  Procedure Laterality Date  . GANGLION CYST EXCISION      Medications reviewed. Current Outpatient Prescriptions  Medication Sig Dispense Refill  . dicyclomine (BENTYL) 10 MG capsule Take 1 capsule (10 mg total) by mouth as directed. 2-3 times daily before meals 45 capsule 2  . DULoxetine (CYMBALTA) 20 MG capsule Take 20 mg by mouth daily.    Marland Kitchen. lamoTRIgine (LAMICTAL) 100 MG tablet Take 50 mg by mouth 2 (two) times daily.    . Levonorgestrel (SKYLA) 13.5 MG IUD  by Intrauterine route.    . Multiple Vitamin (MULTI VITAMIN DAILY PO) Take by mouth.    . ondansetron (ZOFRAN ODT) 4 MG disintegrating tablet Take 1 tablet (4 mg total) by mouth every 6 (six) hours as needed for nausea or vomiting. 45 tablet 3  . Probiotic Product (PROBIOTIC-10 PO) Take by mouth.    . clonazePAM (KLONOPIN) 0.5 MG tablet Take 0.5 mg by mouth as needed for anxiety.    Marland Kitchen lurasidone (LATUDA) 40 MG TABS tablet Take 40 mg by mouth daily with breakfast.     Current Facility-Administered Medications  Medication Dose Route Frequency Provider Last Rate Last Dose  . 0.9 %  sodium chloride infusion  500 mL Intravenous Continuous Sherrilyn Rist, MD         Physical Exam:   There were no vitals taken for this visit. Gen:  Alert, cooperative patient who appears stated age in no acute distress.  Vital signs reviewed. Head: Sharon/AT.   Eyes:  EOMI, PERRL.   Ears:  External ears WNL, Bilateral TM's normal without retraction, redness or bulging. Nose:  Septum midline  Mouth:  MMM, tonsils non-erythematous, non-edematous.   Neck:  Some posterior cervical adenopathy noted.  No nodes greater than 0.5 cm in diameter, 3-4 in total, all mobile and nontender.   Lymph:  Cervical adenopathy as above.  No axillary adenopathy.   Pulm:  Clear to auscultation bilaterally with good air movement.  No wheezes or rales noted.   Cardiac:  Regular rate and rhythm without murmur auscultated.  Good S1/S2. Abd:  Soft/nondistended/nontender.  Good BS throughout Exts: No LE edema.   Neuro:  Awake and alert x 4.  Strength and sensory testing all 5/5 in all four limbs and symmetric.  Skin:  Does have extensive bruising over arms and legs.  With some pinpoint scars dorsum of both feet she attributes to flea bites from infestation last summer Psych:  Not depressed or anxious appearing.  Linear and coherent thought process as evidenced by speech pattern. Smiles spontaneously.    Assessment and Plan:  1.  Fatigue and myalgias: - Unclear etiology.  No clear diagnosis.  Possibility of rheumatologic issues based on her symptoms of proximal muscle weakness and AM chills/rigors plus increased fatigue.   - no evidence of current infectious process.   - as noted above and in prior notes/admission notes the patient does have an extensive psych history  - she has been seen by rheumatology.  We don't have those lab results.  Discussed this with patient, she prefers to have them repeated. - will repeat broad-based labs:  Sed rate, CRP, ANA, RF, CK due to muscle soreness.  Will also test for Lyme.  Discussed with patient the false-positive rates of this.   - Await results.  Will discuss with patient when  they return.  35 minutes patient care, with 30 minutes face time with patient.

## 2016-11-25 LAB — COMPREHENSIVE METABOLIC PANEL
ALT: 13 IU/L (ref 0–32)
AST: 16 IU/L (ref 0–40)
Albumin/Globulin Ratio: 2 (ref 1.2–2.2)
Albumin: 4.2 g/dL (ref 3.5–5.5)
Alkaline Phosphatase: 57 IU/L (ref 39–117)
BUN/Creatinine Ratio: 14 (ref 9–23)
BUN: 7 mg/dL (ref 6–20)
Bilirubin Total: 0.3 mg/dL (ref 0.0–1.2)
CO2: 20 mmol/L (ref 18–29)
Calcium: 8.8 mg/dL (ref 8.7–10.2)
Chloride: 102 mmol/L (ref 96–106)
Creatinine, Ser: 0.51 mg/dL — ABNORMAL LOW (ref 0.57–1.00)
GFR calc Af Amer: 159 mL/min/{1.73_m2} (ref >59)
GFR calc non Af Amer: 138 mL/min/{1.73_m2} (ref >59)
Globulin, Total: 2.1 g/dL (ref 1.5–4.5)
Glucose: 84 mg/dL (ref 65–99)
Potassium: 4.3 mmol/L (ref 3.5–5.2)
Sodium: 139 mmol/L (ref 134–144)
Total Protein: 6.3 g/dL (ref 6.0–8.5)

## 2016-11-25 LAB — CBC WITH DIFFERENTIAL/PLATELET
Basophils Absolute: 0 10*3/uL (ref 0.0–0.2)
Basos: 1 %
EOS (ABSOLUTE): 0.1 10*3/uL (ref 0.0–0.4)
Eos: 1 %
Hematocrit: 38.3 % (ref 34.0–46.6)
Hemoglobin: 12.9 g/dL (ref 11.1–15.9)
Immature Grans (Abs): 0 10*3/uL (ref 0.0–0.1)
Immature Granulocytes: 0 %
Lymphocytes Absolute: 2.2 10*3/uL (ref 0.7–3.1)
Lymphs: 34 %
MCH: 31.1 pg (ref 26.6–33.0)
MCHC: 33.7 g/dL (ref 31.5–35.7)
MCV: 92 fL (ref 79–97)
Monocytes Absolute: 0.3 10*3/uL (ref 0.1–0.9)
Monocytes: 4 %
Neutrophils Absolute: 4 10*3/uL (ref 1.4–7.0)
Neutrophils: 60 %
Platelets: 269 10*3/uL (ref 150–379)
RBC: 4.15 x10E6/uL (ref 3.77–5.28)
RDW: 13.5 % (ref 12.3–15.4)
WBC: 6.6 10*3/uL (ref 3.4–10.8)

## 2016-11-25 LAB — TSH: TSH: 3.06 u[IU]/mL (ref 0.450–4.500)

## 2016-11-25 LAB — C-REACTIVE PROTEIN: CRP: 0.4 mg/L (ref 0.0–4.9)

## 2016-11-25 LAB — LYME AB/WESTERN BLOT REFLEX
LYME DISEASE AB, QUANT, IGM: <0.8 index (ref 0.00–0.79)
Lyme IgG/IgM Ab: <0.91 {ISR} (ref 0.00–0.90)

## 2016-11-25 LAB — CK: Total CK: 55 U/L (ref 24–173)

## 2016-11-25 LAB — RHEUMATOID FACTOR: Rhuematoid fact SerPl-aCnc: <10 IU/mL (ref 0.0–13.9)

## 2016-11-25 LAB — SEDIMENTATION RATE: Sed Rate: 2 mm/hr (ref 0–32)

## 2016-11-25 LAB — T4, FREE: Free T4: 1.13 ng/dL (ref 0.82–1.77)

## 2016-11-28 ENCOUNTER — Encounter: Payer: Self-pay | Admitting: Family Medicine

## 2016-11-28 DIAGNOSIS — F411 Generalized anxiety disorder: Secondary | ICD-10-CM | POA: Diagnosis not present

## 2016-11-28 DIAGNOSIS — F3132 Bipolar disorder, current episode depressed, moderate: Secondary | ICD-10-CM | POA: Diagnosis not present

## 2016-12-09 ENCOUNTER — Ambulatory Visit (INDEPENDENT_AMBULATORY_CARE_PROVIDER_SITE_OTHER): Payer: BLUE CROSS/BLUE SHIELD | Admitting: Family Medicine

## 2016-12-09 ENCOUNTER — Encounter: Payer: Self-pay | Admitting: Family Medicine

## 2016-12-09 VITALS — BP 98/63 | HR 61 | Temp 98.9°F | Resp 18 | Ht 68.11 in | Wt 146.0 lb

## 2016-12-09 DIAGNOSIS — M797 Fibromyalgia: Secondary | ICD-10-CM | POA: Diagnosis not present

## 2016-12-09 MED ORDER — GABAPENTIN 100 MG PO CAPS: 100.0000 mg | ORAL_CAPSULE | Freq: Three times a day (TID) | ORAL | 0 refills | Status: DC

## 2016-12-09 NOTE — Progress Notes (Signed)
Chief Complaint  Patient presents with  . Medication Management    would like something different for her fibromyalgia because the cymbalta is not helping.     HPI  She reports that she went to Rheumatology and had polymyositis labs that were negative She states that she had labs done for fatigue recently She feels like her Fibromyalgia is not properly controlled She states that she has symptoms every 3 days She reports that between two weeks and every month She states that she has fatigue as well She is on Cymbalta 30mg  and she tried Cymbalta 60mg     Past Medical History:  Diagnosis Date  . Anxiety   . Depression   . Erosive gastropathy   . Fibromyalgia   . IBS (irritable bowel syndrome)     Current Outpatient Prescriptions  Medication Sig Dispense Refill  . clonazePAM (KLONOPIN) 0.5 MG tablet Take 0.5 mg by mouth as needed for anxiety.    . dicyclomine (BENTYL) 10 MG capsule Take 1 capsule (10 mg total) by mouth as directed. 2-3 times daily before meals 45 capsule 2  . lamoTRIgine (LAMICTAL) 100 MG tablet Take 50 mg by mouth 2 (two) times daily.    . Levonorgestrel (SKYLA) 13.5 MG IUD by Intrauterine route.    . Multiple Vitamin (MULTI VITAMIN DAILY PO) Take by mouth.    . ondansetron (ZOFRAN ODT) 4 MG disintegrating tablet Take 1 tablet (4 mg total) by mouth every 6 (six) hours as needed for nausea or vomiting. 45 tablet 3  . Probiotic Product (PROBIOTIC-10 PO) Take by mouth.    . gabapentin (NEURONTIN) 100 MG capsule Take 1 capsule (100 mg total) by mouth 3 (three) times daily. Then increase weekly to a goal of 300mg  three times daily. 90 capsule 0   Current Facility-Administered Medications  Medication Dose Route Frequency Provider Last Rate Last Dose  . 0.9 %  sodium chloride infusion  500 mL Intravenous Continuous Danis, Andreas BlowerHenry L III, MD        Allergies: No Known Allergies  Past Surgical History:  Procedure Laterality Date  . GANGLION CYST EXCISION       Social History   Social History  . Marital status: Single    Spouse name: N/A  . Number of children: N/A  . Years of education: N/A   Occupational History  . Student    Social History Main Topics  . Smoking status: Current Every Day Smoker    Packs/day: 0.10    Years: 1.00  . Smokeless tobacco: Never Used     Comment: also has an E cigarette  . Alcohol use 0.0 oz/week     Comment: 3 drinks liquor at a party monthly  . Drug use: Yes    Types: Marijuana     Comment: THC once month 1/8th gram  . Sexual activity: Yes    Birth control/ protection: IUD   Other Topics Concern  . None   Social History Narrative  . None    Review of Systems  Constitutional: Positive for malaise/fatigue. Negative for chills and fever.  Cardiovascular: Negative for chest pain and palpitations.  Gastrointestinal: Negative for abdominal pain, nausea and vomiting.  Musculoskeletal: Positive for myalgias. Negative for back pain, joint pain and neck pain.  Neurological: Negative for dizziness, tingling, tremors and headaches.    Objective: Vitals:   12/09/16 1537  BP: 98/63  Pulse: 61  Resp: 18  Temp: 98.9 F (37.2 C)  TempSrc: Oral  SpO2: 100%  Weight: 146  lb (66.2 kg)  Height: 5' 8.11" (1.73 m)    Physical Exam  Constitutional: She is oriented to person, place, and time. She appears well-developed and well-nourished.  HENT:  Head: Normocephalic and atraumatic.  Eyes: Conjunctivae and EOM are normal.  Cardiovascular: Normal rate, regular rhythm and normal heart sounds.   No murmur heard. Pulmonary/Chest: Effort normal and breath sounds normal. No respiratory distress. She has no wheezes.  Neurological: She is alert and oriented to person, place, and time.  Psychiatric: She has a normal mood and affect. Her behavior is normal. Judgment and thought content normal.    Assessment and Plan Bret was seen today for medication management.  Diagnoses and all orders for this  visit:  Fibromyalgia  Decrease your Cymbalta to 30mg  and take for 5 days then switch to Gabapentin Titrate to 300mg  tid  -     gabapentin (NEURONTIN) 100 MG capsule; Take 1 capsule (100 mg total) by mouth 3 (three) times daily. Then increase weekly to a goal of 300mg  three times daily.      Return to clinic in one month to discuss how you are doing and to set up refills  Caleel Kiner A Creta Levin

## 2016-12-09 NOTE — Patient Instructions (Addendum)
  Decrease your Cymbalta to 30mg  and take for 5 days then switch to Gabapentin Start with  Week 1: 100mg  three times a day then increase to 200mg   Week 2: 200mg  three times a day then increase to 300mg   Week 3: 300mg  three times a day  Return to clinic in one month to discuss how you are doing and to set up refills    IF you received an x-ray today, you will receive an invoice from [facility]. Please contact [facility] at [phone] with questions or concerns regarding your invoice.   IF you received labwork today, you will receive an invoice from [facility]. Please contact [facility] at [phone] with questions or concerns regarding your invoice.   Our billing staff will not be able to assist you with questions regarding bills from these companies.  You will be contacted with the lab results as soon as they are available. The fastest way to get your results is to activate your My Chart account. Instructions are located on the last page of this paperwork. If you have not heard from us regarding the results in 2 weeks, please contact this office.

## 2016-12-30 ENCOUNTER — Encounter: Payer: Self-pay | Admitting: Family Medicine

## 2016-12-30 ENCOUNTER — Ambulatory Visit (INDEPENDENT_AMBULATORY_CARE_PROVIDER_SITE_OTHER): Payer: BLUE CROSS/BLUE SHIELD | Admitting: Family Medicine

## 2016-12-30 VITALS — BP 102/63 | HR 57 | Temp 98.2°F | Resp 16 | Ht 68.5 in | Wt 149.8 lb

## 2016-12-30 DIAGNOSIS — R519 Headache, unspecified: Secondary | ICD-10-CM

## 2016-12-30 DIAGNOSIS — R51 Headache: Secondary | ICD-10-CM | POA: Diagnosis not present

## 2016-12-30 DIAGNOSIS — R5383 Other fatigue: Secondary | ICD-10-CM | POA: Diagnosis not present

## 2016-12-30 DIAGNOSIS — M797 Fibromyalgia: Secondary | ICD-10-CM

## 2016-12-30 DIAGNOSIS — F3130 Bipolar disorder, current episode depressed, mild or moderate severity, unspecified: Secondary | ICD-10-CM | POA: Diagnosis not present

## 2016-12-30 DIAGNOSIS — K580 Irritable bowel syndrome with diarrhea: Secondary | ICD-10-CM | POA: Diagnosis not present

## 2016-12-30 MED ORDER — GABAPENTIN 300 MG PO CAPS: 300.0000 mg | ORAL_CAPSULE | Freq: Three times a day (TID) | ORAL | 11 refills | Status: DC

## 2016-12-30 NOTE — Patient Instructions (Addendum)
Is there any benefit to evaluating for nacrolepsy?  Can her lamictal be used to treat her headaches?    IF you received an x-ray today, you will receive an invoice from [facility]. Please contact [facility] at [phone] with questions or concerns regarding your invoice.   IF you received labwork today, you will receive an invoice from [facility]. Please contact [facility] at [phone] with questions or concerns regarding your invoice.   Our billing staff will not be able to assist you with questions regarding bills from these companies.  You will be contacted with the lab results as soon as they are available. The fastest way to get your results is to activate your My Chart account. Instructions are located on the last page of this paperwork. If you have not heard from us regarding the results in 2 weeks, please contact this office.

## 2016-12-30 NOTE — Progress Notes (Addendum)
Chief Complaint  Patient presents with  . Medication Refill    followup on medication, pt needs neurotin refill.  Per patient having ha's since stopping cymbalta and wonders if she can get medication to help with fatigue as pt says she is sleeping 16-18 hours a day sometimes    HPI   Patient states that she is weaning  She goes to [facility] and is seeing Psychiatry.  She reports that she is on Lamictal, Cymbalta, Gabapentin and Clonazepam. She reports that she feels like she is crying all the time now that she is thinks that it is still withdrawal from her Cymbalta. She has been off Cymbalta for 3 weeks.   For her fibromyalgia she states that gabapentin is helping. CBD oil is helping her  She reports that she is sleeping for 16-18 hours a day.  She does not have narcolepsy She reports that she cannot do caffeine because of IBS and so she cannot tolerate caffeine She takes Bentyl for IBS  She had a full work up for lyme disease, autoimmune disorders, muscular dystrophy and polymyositis.   Past Medical History:  Diagnosis Date  . Anxiety   . Depression   . Erosive gastropathy   . Fibromyalgia   . IBS (irritable bowel syndrome)     Current Outpatient Prescriptions  Medication Sig Dispense Refill  . clonazePAM (KLONOPIN) 0.5 MG tablet Take 0.5 mg by mouth as needed for anxiety.    . dicyclomine (BENTYL) 10 MG capsule Take 1 capsule (10 mg total) by mouth as directed. 2-3 times daily before meals 45 capsule 2  . gabapentin (NEURONTIN) 300 MG capsule Take 1 capsule (300 mg total) by mouth 3 (three) times daily. 90 capsule 11  . lamoTRIgine (LAMICTAL) 100 MG tablet Take 50 mg by mouth 2 (two) times daily.    . Levonorgestrel (SKYLA) 13.5 MG IUD by Intrauterine route.    . Multiple Vitamin (MULTI VITAMIN DAILY PO) Take by mouth.    . ondansetron (ZOFRAN ODT) 4 MG disintegrating tablet Take 1 tablet (4 mg total) by mouth every 6 (six) hours as needed for nausea  or vomiting. 45 tablet 3  . Probiotic Product (PROBIOTIC-10 PO) Take by mouth.     Current Facility-Administered Medications  Medication Dose Route Frequency Provider Last Rate Last Dose  . 0.9 %  sodium chloride infusion  500 mL Intravenous Continuous Danis, Andreas Blower, MD        Allergies: No Known Allergies  Past Surgical History:  Procedure Laterality Date  . GANGLION CYST EXCISION      Social History   Social History  . Marital status: Single    Spouse name: N/A  . Number of children: N/A  . Years of education: N/A   Occupational History  . Student    Social History Main Topics  . Smoking status: Former Smoker    Packs/day: 0.10    Years: 1.00    Quit date: 12/09/2016  . Smokeless tobacco: Never Used     Comment: also has an E cigarette  . Alcohol use 0.0 oz/week     Comment: 3 drinks liquor at a party monthly  . Drug use: Yes    Types: Marijuana     Comment: THC once month 1/8th gram  . Sexual activity: Yes    Birth control/ protection: IUD   Other Topics Concern  . None   Social History Narrative  . None    ROS  Objective: Vitals:  12/30/16 1000  BP: 102/63  Pulse: (!) 57  Resp: 16  Temp: 98.2 F (36.8 C)  TempSrc: Oral  SpO2: 100%  Weight: 149 lb 12.8 oz (67.9 kg)  Height: 5' 8.5" (1.74 m)    Physical Exam  Constitutional: She is oriented to person, place, and time. She appears well-developed and well-nourished.  HENT:  Head: Normocephalic and atraumatic.  Eyes: Conjunctivae and EOM are normal.  Cardiovascular: Normal rate, regular rhythm and normal heart sounds.   Pulmonary/Chest: Effort normal and breath sounds normal. No respiratory distress. She has no wheezes.  Neurological: She is alert and oriented to person, place, and time.  Psychiatric: She has a normal mood and affect. Her behavior is normal. Judgment and thought content normal.    Assessment and Plan Ramonica was seen today for medication refill.  Diagnoses and all orders  for this visit:  Fibromyalgia Other fatigue Bipolar affective disorder, current episode depressed, current episode severity unspecified (HCC) Irritable bowel syndrome with diarrhea Frequent headaches  Other orders -     gabapentin (NEURONTIN) 300 MG capsule; Take 1 capsule (300 mg total) by mouth 3 (three) times daily.  Explained to patient that since I do not typically initiate or manage medications for bipolar medications she should follow up with Psychiatry Explained to her that she has some overlapping comorbidities  She might be having worsening headaches because of withdrawal from Cymbalta She also could see improvement in headaches with increased lamictal and gabapentin.  Advised pt to follow up with Psychiatry She should also discuss if she has narcolepsy with Psychiatry    A total of 20 minutes were spent face-to-face with the patient during this encounter and over half of that time was spent on counseling and coordination of care.    Bernie Ransford A Nayelie Gionfriddo

## 2017-01-02 DIAGNOSIS — F411 Generalized anxiety disorder: Secondary | ICD-10-CM | POA: Diagnosis not present

## 2017-01-02 DIAGNOSIS — F329 Major depressive disorder, single episode, unspecified: Secondary | ICD-10-CM | POA: Diagnosis not present

## 2017-01-02 DIAGNOSIS — F3132 Bipolar disorder, current episode depressed, moderate: Secondary | ICD-10-CM | POA: Diagnosis not present

## 2017-01-02 DIAGNOSIS — F410 Panic disorder [episodic paroxysmal anxiety] without agoraphobia: Secondary | ICD-10-CM | POA: Diagnosis not present

## 2017-01-02 DIAGNOSIS — F419 Anxiety disorder, unspecified: Secondary | ICD-10-CM | POA: Diagnosis not present

## 2017-01-02 DIAGNOSIS — F3111 Bipolar disorder, current episode manic without psychotic features, mild: Secondary | ICD-10-CM | POA: Diagnosis not present

## 2017-01-09 ENCOUNTER — Encounter: Payer: Self-pay | Admitting: Family Medicine

## 2017-01-09 DIAGNOSIS — F329 Major depressive disorder, single episode, unspecified: Secondary | ICD-10-CM | POA: Diagnosis not present

## 2017-01-09 DIAGNOSIS — F410 Panic disorder [episodic paroxysmal anxiety] without agoraphobia: Secondary | ICD-10-CM | POA: Diagnosis not present

## 2017-01-09 DIAGNOSIS — F419 Anxiety disorder, unspecified: Secondary | ICD-10-CM | POA: Diagnosis not present

## 2017-01-09 DIAGNOSIS — F3111 Bipolar disorder, current episode manic without psychotic features, mild: Secondary | ICD-10-CM | POA: Diagnosis not present

## 2017-01-16 DIAGNOSIS — F329 Major depressive disorder, single episode, unspecified: Secondary | ICD-10-CM | POA: Diagnosis not present

## 2017-01-16 DIAGNOSIS — F419 Anxiety disorder, unspecified: Secondary | ICD-10-CM | POA: Diagnosis not present

## 2017-01-16 DIAGNOSIS — F3111 Bipolar disorder, current episode manic without psychotic features, mild: Secondary | ICD-10-CM | POA: Diagnosis not present

## 2017-01-16 DIAGNOSIS — F410 Panic disorder [episodic paroxysmal anxiety] without agoraphobia: Secondary | ICD-10-CM | POA: Diagnosis not present

## 2017-01-23 DIAGNOSIS — F419 Anxiety disorder, unspecified: Secondary | ICD-10-CM | POA: Diagnosis not present

## 2017-01-23 DIAGNOSIS — F3111 Bipolar disorder, current episode manic without psychotic features, mild: Secondary | ICD-10-CM | POA: Diagnosis not present

## 2017-01-23 DIAGNOSIS — F329 Major depressive disorder, single episode, unspecified: Secondary | ICD-10-CM | POA: Diagnosis not present

## 2017-01-23 DIAGNOSIS — F410 Panic disorder [episodic paroxysmal anxiety] without agoraphobia: Secondary | ICD-10-CM | POA: Diagnosis not present

## 2017-01-30 DIAGNOSIS — F411 Generalized anxiety disorder: Secondary | ICD-10-CM | POA: Diagnosis not present

## 2017-01-30 DIAGNOSIS — F3132 Bipolar disorder, current episode depressed, moderate: Secondary | ICD-10-CM | POA: Diagnosis not present

## 2017-02-06 DIAGNOSIS — F3111 Bipolar disorder, current episode manic without psychotic features, mild: Secondary | ICD-10-CM | POA: Diagnosis not present

## 2017-02-13 DIAGNOSIS — F419 Anxiety disorder, unspecified: Secondary | ICD-10-CM | POA: Diagnosis not present

## 2017-02-13 DIAGNOSIS — F3111 Bipolar disorder, current episode manic without psychotic features, mild: Secondary | ICD-10-CM | POA: Diagnosis not present

## 2017-02-13 DIAGNOSIS — F410 Panic disorder [episodic paroxysmal anxiety] without agoraphobia: Secondary | ICD-10-CM | POA: Diagnosis not present

## 2017-02-13 DIAGNOSIS — F329 Major depressive disorder, single episode, unspecified: Secondary | ICD-10-CM | POA: Diagnosis not present

## 2017-02-27 DIAGNOSIS — F419 Anxiety disorder, unspecified: Secondary | ICD-10-CM | POA: Diagnosis not present

## 2017-02-27 DIAGNOSIS — F3111 Bipolar disorder, current episode manic without psychotic features, mild: Secondary | ICD-10-CM | POA: Diagnosis not present

## 2017-02-27 DIAGNOSIS — F329 Major depressive disorder, single episode, unspecified: Secondary | ICD-10-CM | POA: Diagnosis not present

## 2017-02-27 DIAGNOSIS — F410 Panic disorder [episodic paroxysmal anxiety] without agoraphobia: Secondary | ICD-10-CM | POA: Diagnosis not present

## 2017-03-09 ENCOUNTER — Encounter: Payer: Self-pay | Admitting: Family Medicine

## 2017-03-09 ENCOUNTER — Ambulatory Visit (INDEPENDENT_AMBULATORY_CARE_PROVIDER_SITE_OTHER): Payer: BLUE CROSS/BLUE SHIELD | Admitting: Family Medicine

## 2017-03-09 VITALS — BP 95/61 | HR 90 | Temp 98.6°F | Resp 16 | Ht 68.5 in | Wt 149.6 lb

## 2017-03-09 DIAGNOSIS — J069 Acute upper respiratory infection, unspecified: Secondary | ICD-10-CM | POA: Diagnosis not present

## 2017-03-09 NOTE — Progress Notes (Signed)
8/30/20185:02 PM  Tanajah Lac 1996-01-28, 21 y.o. female [id number]  Chief Complaint  Patient presents with  . URI    ? sinus infection, taking mucinex and alka seltzer for sxs, fever overnight and this morning, no present temp, woke and throat flamming red and hurting    HPI:   Patient is a 21 y.o. who presents today requesting a doctors excuse for today for school. She has been having a cold for the past 2 days, sinus pressure, thick mucous, nasal congestion, ear pressure, sore throat, fever earlier in the course but none now. Has been taking OTC cold medications, doing nasal saline washes, increasing fluids. Denies any SOB.  Depression screen [facility] 2/9 03/09/2017 12/30/2016 12/09/2016  Decreased Interest 1 - 0  Down, Depressed, Hopeless 1 3 0  PHQ - 2 Score 2 3 0  Altered sleeping 2 2 -  Tired, decreased energy 1 2 -  Change in appetite 0 0 -  Feeling bad or failure about yourself  0 0 -  Trouble concentrating 1 2 -  Moving slowly or fidgety/restless 1 3 -  Suicidal thoughts 0 0 -  PHQ-9 Score 7 12 -  Difficult doing work/chores - Extremely dIfficult -    No Known Allergies  Current Outpatient Prescriptions on File Prior to Visit  Medication Sig Dispense Refill  . clonazePAM (KLONOPIN) 0.5 MG tablet Take 0.5 mg by mouth as needed for anxiety.    . dicyclomine (BENTYL) 10 MG capsule Take 1 capsule (10 mg total) by mouth as directed. 2-3 times daily before meals 45 capsule 2  . gabapentin (NEURONTIN) 300 MG capsule Take 1 capsule (300 mg total) by mouth 3 (three) times daily. 90 capsule 11  . lamoTRIgine (LAMICTAL) 100 MG tablet Take 50 mg by mouth 2 (two) times daily.    . Levonorgestrel (SKYLA) 13.5 MG IUD by Intrauterine route.    . Multiple Vitamin (MULTI VITAMIN DAILY PO) Take by mouth.    . ondansetron (ZOFRAN ODT) 4 MG disintegrating tablet Take 1 tablet (4 mg total) by mouth every 6 (six) hours as needed for nausea or vomiting. 45 tablet 3  . Probiotic Product  (PROBIOTIC-10 PO) Take by mouth.     Current Facility-Administered Medications on File Prior to Visit  Medication Dose Route Frequency Provider Last Rate Last Dose  . 0.9 %  sodium chloride infusion  500 mL Intravenous Continuous Sherrilyn Rist, MD        Past Medical History:  Diagnosis Date  . Anxiety   . Depression   . Erosive gastropathy   . Fibromyalgia   . IBS (irritable bowel syndrome)     Past Surgical History:  Procedure Laterality Date  . GANGLION CYST EXCISION      Social History  Substance Use Topics  . Smoking status: Former Smoker    Packs/day: 0.10    Years: 1.00    Quit date: 12/09/2016  . Smokeless tobacco: Never Used     Comment: also has an E cigarette  . Alcohol use 0.0 oz/week     Comment: 3 drinks liquor at a party monthly    Family History  Problem Relation Age of Onset  . Colon polyps Father     Review of Systems  Constitutional: Positive for fever. Negative for chills.  HENT: Positive for congestion, ear pain, sinus pain and sore throat.   Respiratory: Negative for cough and shortness of breath.   Gastrointestinal: Negative for abdominal pain, nausea and vomiting.  OBJECTIVE:  Blood pressure 95/61, pulse 90, temperature 98.6 F (37 C), temperature source Oral, resp. rate 16, height 5' 8.5" (1.74 m), weight 149 lb 9.6 oz (67.9 kg), SpO2 97 %.  Physical Exam  Constitutional: She is oriented to person, place, and time and well-developed, well-nourished, and in no distress.  Blowing her nose during most of our visit  HENT:  Head: Normocephalic and atraumatic.  Right Ear: Hearing, tympanic membrane, external ear and ear canal normal.  Left Ear: Hearing, tympanic membrane, external ear and ear canal normal.  Nose: Mucosal edema and rhinorrhea present. Right sinus exhibits no maxillary sinus tenderness and no frontal sinus tenderness. Left sinus exhibits no maxillary sinus tenderness and no frontal sinus tenderness.  Mouth/Throat:  Mucous membranes are normal. Posterior oropharyngeal edema and posterior oropharyngeal erythema present. No oropharyngeal exudate.  Eyes: Pupils are equal, round, and reactive to light. EOM are normal.  Neck: Neck supple.  Cardiovascular: Normal rate, regular rhythm and normal heart sounds.  Exam reveals no gallop and no friction rub.   No murmur heard. Pulmonary/Chest: Effort normal and breath sounds normal. She has no wheezes. She has no rales.  Lymphadenopathy:    She has no cervical adenopathy.  Neurological: She is alert and oriented to person, place, and time. Gait normal.  Skin: Skin is warm and dry.       ASSESSMENT and PLAN:  URI, acute Discussed supportive measures for URI: increase hydration, rest, OTC medications, etc. RTC precautions discussed. Excuse for school given.      Myles LippsIrma M Santiago, MD Primary [facility] at [facility] [address] [place], [place] [address] Ph.  [phone] Fax [phone]

## 2017-03-09 NOTE — Patient Instructions (Signed)
     IF you received an x-ray today, you will receive an invoice from [facility]. Please contact [facility] at [phone] with questions or concerns regarding your invoice.   IF you received labwork today, you will receive an invoice from [facility]. Please contact [facility] at [phone] with questions or concerns regarding your invoice.   Our billing staff will not be able to assist you with questions regarding bills from these companies.  You will be contacted with the lab results as soon as they are available. The fastest way to get your results is to activate your My Chart account. Instructions are located on the last page of this paperwork. If you have not heard from us regarding the results in 2 weeks, please contact this office.     

## 2017-03-15 ENCOUNTER — Telehealth: Payer: Self-pay | Admitting: Family Medicine

## 2017-03-15 DIAGNOSIS — M797 Fibromyalgia: Secondary | ICD-10-CM

## 2017-03-15 NOTE — Telephone Encounter (Signed)
Pt called regarding rheum referral we [facility] in [facility] to [facility]. She wanted to know if she could be referred to a new rheumatologist because [facility] and they ended up testing her for polymyositis and was told if this came back negative she did not need to bother coming back in. She said she had to end up calling their office to find out what her results were. She has been trying to manage fibromyalgia with neurologist but stated this was unsuccessful as well. She wanted to know if she could have a referral sent to another office that specifically deals with fibromyalgia. Please advise. Pt can be reached at [phone]. Thanks!

## 2017-03-22 DIAGNOSIS — M25551 Pain in right hip: Secondary | ICD-10-CM | POA: Diagnosis not present

## 2017-03-22 DIAGNOSIS — M545 Low back pain: Secondary | ICD-10-CM | POA: Diagnosis not present

## 2017-03-22 DIAGNOSIS — M2560 Stiffness of unspecified joint, not elsewhere classified: Secondary | ICD-10-CM | POA: Diagnosis not present

## 2017-03-22 DIAGNOSIS — R293 Abnormal posture: Secondary | ICD-10-CM | POA: Diagnosis not present

## 2017-03-22 NOTE — Telephone Encounter (Signed)
Let her know that unless she has a complicated case most rheumatologist do not manage fibromyalgia.  Many patients have to go [facility] at [phone]. I placed a referral.

## 2017-03-23 NOTE — Telephone Encounter (Signed)
Left message on voicemail to return my call (Delores). Dgaddy, CMA 

## 2017-03-27 DIAGNOSIS — M545 Low back pain: Secondary | ICD-10-CM | POA: Diagnosis not present

## 2017-03-27 DIAGNOSIS — M2560 Stiffness of unspecified joint, not elsewhere classified: Secondary | ICD-10-CM | POA: Diagnosis not present

## 2017-03-27 DIAGNOSIS — R293 Abnormal posture: Secondary | ICD-10-CM | POA: Diagnosis not present

## 2017-03-27 DIAGNOSIS — M25551 Pain in right hip: Secondary | ICD-10-CM | POA: Diagnosis not present

## 2017-03-28 ENCOUNTER — Encounter: Payer: Self-pay | Admitting: Obstetrics and Gynecology

## 2017-03-28 ENCOUNTER — Ambulatory Visit (INDEPENDENT_AMBULATORY_CARE_PROVIDER_SITE_OTHER): Payer: BLUE CROSS/BLUE SHIELD | Admitting: Obstetrics and Gynecology

## 2017-03-28 ENCOUNTER — Other Ambulatory Visit (HOSPITAL_COMMUNITY)
Admission: RE | Admit: 2017-03-28 | Discharge: 2017-03-28 | Disposition: A | Discharge status: Home / Self Care | Payer: BLUE CROSS/BLUE SHIELD | Source: Ambulatory Visit | Attending: Obstetrics and Gynecology | Admitting: Obstetrics and Gynecology

## 2017-03-28 VITALS — BP 110/60 | HR 60 | Resp 16 | Ht 67.5 in | Wt 151.0 lb

## 2017-03-28 DIAGNOSIS — R293 Abnormal posture: Secondary | ICD-10-CM | POA: Diagnosis not present

## 2017-03-28 DIAGNOSIS — Z23 Encounter for immunization: Secondary | ICD-10-CM | POA: Diagnosis not present

## 2017-03-28 DIAGNOSIS — M545 Low back pain: Secondary | ICD-10-CM | POA: Diagnosis not present

## 2017-03-28 DIAGNOSIS — Z01419 Encounter for gynecological examination (general) (routine) without abnormal findings: Secondary | ICD-10-CM

## 2017-03-28 DIAGNOSIS — N6452 Nipple discharge: Secondary | ICD-10-CM | POA: Diagnosis not present

## 2017-03-28 DIAGNOSIS — M2560 Stiffness of unspecified joint, not elsewhere classified: Secondary | ICD-10-CM | POA: Diagnosis not present

## 2017-03-28 DIAGNOSIS — R102 Pelvic and perineal pain: Secondary | ICD-10-CM | POA: Diagnosis not present

## 2017-03-28 DIAGNOSIS — M25551 Pain in right hip: Secondary | ICD-10-CM | POA: Diagnosis not present

## 2017-03-28 DIAGNOSIS — Z803 Family history of malignant neoplasm of breast: Secondary | ICD-10-CM

## 2017-03-28 NOTE — Progress Notes (Signed)
21 y.o. G0P0000 Single Caucasian female here as a new patient for annual exam.    Wants a pap smear and a tetanus vaccine.   FH breast cancer.  Mother and great grandmother with breast disease.  Mother had precancerous change of the breast in her 47s.  Great grandmother had breast cancer.  Uncertain if bother had genetic testing.   Has Skyla IUD.   Due for removal in December.   Used Nexplanon in the past and had mood swings.  Then had dx of bipolar.  Has some inconsistent abdominal pain for the past year.  Describes as crampy and low and occurs once or twice a month.  Pain lasts 1 hour to one day.  Some of the pain is related to her menses. Pain lingers around the time of her cycle or midcycle pain.  Not sure if she has IBS which centralizes in her stomach.  Had an upper endoscopy.  Normal pelvic US on 02/15/16.   IUD in proper position.   States dx of fibromyalgia.  Reporting bilateral breast discharge.  Discharge on the right started after taking an antipsychotic.  More milky on right and pus like on the left.  Normal TSH in May 2018.  Labs in the last 6 months in [facility].  Declines STD testing and had STD testing 1.5 years ago after began relationship with her current partner.   PCP:   Collie Siad, MD - Primary Care at [facility]  Patient's last menstrual period was 03/25/2017.           Sexually active: Yes.    The current method of family planning is Christean Grief IUD - inserted December 2015.    Exercising: Yes.    yoga and gym Smoker:  yes  Health Maintenance: Pap:  never History of abnormal Pap:  n/a MMG:  n/a Colonoscopy:  n/a BMD:   n/a  Result  n/a TDaP:  Unsure.  Last vaccine was in grade school.  Gardasil:   Did first vaccine while in high school. HIV and Hep C: about 1.5 years ago -- negative per patient Screening Labs: discuss today   reports that she has been smoking Cigarettes.  She has been smoking about 0.25 packs per day for the past 0.00 years.  She has never used smokeless tobacco. She reports that she uses drugs, including Marijuana. She reports that she does not drink alcohol.  Past Medical History:  Diagnosis Date  . Anxiety   . Bipolar depression (HCC)   . Depression   . Erosive gastropathy   . Fibromyalgia   . IBS (irritable bowel syndrome)     Past Surgical History:  Procedure Laterality Date  . GANGLION CYST EXCISION      Current Outpatient Prescriptions  Medication Sig Dispense Refill  . clonazePAM (KLONOPIN) 0.5 MG tablet Take 0.5 mg by mouth as needed for anxiety.    . dicyclomine (BENTYL) 10 MG capsule Take 1 capsule (10 mg total) by mouth as directed. 2-3 times daily before meals 45 capsule 2  . gabapentin (NEURONTIN) 300 MG capsule Take 1 capsule (300 mg total) by mouth 3 (three) times daily. 90 capsule 11  . lamoTRIgine (LAMICTAL) 100 MG tablet Take 50 mg by mouth 2 (two) times daily.    . Levonorgestrel (SKYLA) 13.5 MG IUD by Intrauterine route.    . Multiple Vitamin (MULTI VITAMIN DAILY PO) Take by mouth.    . ondansetron (ZOFRAN ODT) 4 MG disintegrating tablet Take 1 tablet (4 mg total) by  mouth every 6 (six) hours as needed for nausea or vomiting. 45 tablet 3  . Probiotic Product (PROBIOTIC-10 PO) Take by mouth.     No current facility-administered medications for this visit.     Family History  Problem Relation Age of Onset  . Breast cancer Mother   . Depression Mother   . Anxiety disorder Mother   . Colon polyps Father   . Alcoholism Father   . Hypertension Father   . Irritable bowel syndrome Sister   . Stroke Paternal Grandfather   . Heart Problems Paternal Grandfather   . Breast cancer Other   . Breast cancer Other     ROS:  Pertinent items are noted in HPI.  Otherwise, a comprehensive ROS was negative.  Exam:   BP 110/60 (BP Location: Right Arm, Patient Position: Sitting, Cuff Size: Normal)   Pulse 60   Resp 16   Ht 5' 7.5" (1.715 m)   Wt 151 lb (68.5 kg)   LMP 03/25/2017   BMI  23.30 kg/m     General appearance: alert, cooperative and appears stated age Head: Normocephalic, without obvious abnormality, atraumatic Neck: no adenopathy, supple, symmetrical, trachea midline and thyroid normal to inspection and palpation Lungs: clear to auscultation bilaterally Breasts: normal appearance, no masses or tenderness, No nipple retraction or dimpling, No nipple discharge or bleeding, No axillary or supraclavicular adenopathy Heart: regular rate and rhythm Abdomen: soft, non-tender; no masses, no organomegaly Extremities: extremities normal, atraumatic, no cyanosis or edema Skin: Skin color, texture, turgor normal. No rashes or lesions Lymph nodes: Cervical, supraclavicular, and axillary nodes normal. No abnormal inguinal nodes palpated Neurologic: Grossly normal  Pelvic: External genitalia:  no lesions              Urethra:  normal appearing urethra with no masses, tenderness or lesions              Bartholins and Skenes: normal                 Vagina: normal appearing vagina with normal color and discharge, no lesions              Cervix: no lesions              Pap taken: Yes.   Bimanual Exam:  Uterus:  normal size, contour, position, consistency, mobility, non-tender              Adnexa: no mass, fullness, tenderness             Chaperone was present for exam.  Assessment:   Well woman visit with normal exam. FH breast cancer.  Pelvic pain.  Skyla IUD. Smoker.  Quitting.  Bilateral breast discharge.  I am unable to confirm this on exam today.  Plan: Mammogram screening discussed. Recommended self breast awareness. Pap and HR HPV as above. Guidelines for Calcium, Vitamin D, regular exercise program including cardiovascular and weight bearing exercise. TDap.  Finish Gardasil series. Return for pelvic US.  We discussed potential future removal of the Skyla and placement of Kyleena IUD. Check prolactin level. Referral for genetic counseling and potential  testing.  She will get more details from her family history prior to this visit at the [facility]. Follow up annually and prn.   After visit summary provided.   __37_____ minutes face to face time of which over 50% was spent in counseling.

## 2017-03-29 ENCOUNTER — Telehealth: Payer: Self-pay | Admitting: Obstetrics and Gynecology

## 2017-03-29 DIAGNOSIS — M25551 Pain in right hip: Secondary | ICD-10-CM | POA: Diagnosis not present

## 2017-03-29 DIAGNOSIS — M545 Low back pain: Secondary | ICD-10-CM | POA: Diagnosis not present

## 2017-03-29 DIAGNOSIS — M2560 Stiffness of unspecified joint, not elsewhere classified: Secondary | ICD-10-CM | POA: Diagnosis not present

## 2017-03-29 DIAGNOSIS — R293 Abnormal posture: Secondary | ICD-10-CM | POA: Diagnosis not present

## 2017-03-29 LAB — PROLACTIN: Prolactin: 18.2 ng/mL (ref 4.8–23.3)

## 2017-03-29 NOTE — Telephone Encounter (Signed)
Call placed to patient to review benefits for a recommended ultrasound. Left voicemail message requesting a return call. °

## 2017-03-30 ENCOUNTER — Telehealth: Payer: Self-pay | Admitting: *Deleted

## 2017-03-30 DIAGNOSIS — Z803 Family history of malignant neoplasm of breast: Secondary | ICD-10-CM

## 2017-03-30 DIAGNOSIS — N6452 Nipple discharge: Secondary | ICD-10-CM

## 2017-03-30 NOTE — Telephone Encounter (Signed)
Spoke with patient, advised as seen below per Dr. Edward Jolly. Patient would like to check on benefit coverage before scheduling genetics couseling. Aware to return call to office for referral or with any additional questions.   Advised of MMG appointment details as seen below. Patient verbalizes understanding and is agreeable. [facility].   Routing to provider for final review. Patient is agreeable to disposition. Will close encounter.

## 2017-03-30 NOTE — Telephone Encounter (Signed)
I would still proceed with the counseling session.  Her mother was very young when she was diagnosed, and the patient's breast cancer screening strategy really needs to be clear.  I think there is still utility in seeing the genetics counselor.  Now days, we test for so much more than BRCA.

## 2017-03-30 NOTE — Telephone Encounter (Signed)
Spoke with Huntley Dec at [facility]. Patient scheduled for bilateral diagnostic MMG and L/R [place] on 04/05/17 arriving at 10:30am for 10:50am appointment.

## 2017-03-30 NOTE — Telephone Encounter (Signed)
Spoke with patient regarding benefit for ultrasound. Patient understood and agreeable. Patient ready to schedule. Patient scheduled 04/06/17 with Dr Holly Bodily. Patient aware of appointment date, arrival time and cancellation policy.  No further questions. Ok to close

## 2017-03-30 NOTE — Telephone Encounter (Signed)
Spoke with patient, advised of results and recommendations as seen below per Dr. Quincy Simmonds. Patient is agreeable with plan. Advised patient RN would call to schedule imaging at [facility] and return call with appointment details.   Patient would like to update Dr. Quincy Simmonds, spoke with her mother regarding genetics testing, testing was done, reports as negative for BRCA gene. Advised patient would update Dr. Quincy Simmonds prior to scheduling MMG, will return call with any additional recommendations. Patient is agreeable.  Dr. Quincy Simmonds -update for genetics testing, any changes in  recommendations?

## 2017-03-30 NOTE — Telephone Encounter (Signed)
-----   Message from Patton Salles, MD sent at 03/29/2017  7:00 PM EDT ----- Please report normal prolactin level to patient.  She has bilateral nipple discharge, different on each breast.  She  has a family hx of mother with precancerous change or cancer of the breast while she was in her 92s. (History of not really clear at this time.) I am recommending patient proceed with diagnostic bilateral mammogram and bilateral breast ultrasound for further evaluation of the nipple discharge and potential hx of breast cancer in her mother.

## 2017-03-31 ENCOUNTER — Telehealth: Payer: Self-pay | Admitting: Obstetrics and Gynecology

## 2017-03-31 LAB — CYTOLOGY - PAP: Diagnosis: NEGATIVE

## 2017-03-31 MED ORDER — MISOPROSTOL 200 MCG PO TABS: ORAL_TABLET | ORAL | 0 refills | Status: DC

## 2017-03-31 NOTE — Telephone Encounter (Signed)
I ordered a pelvic ultrasound for the patient as well.  This should precede the IUD exchange.

## 2017-03-31 NOTE — Addendum Note (Signed)
Addended by: Ginny Forth on: 03/31/2017 03:16 PM   Modules accepted: Orders

## 2017-03-31 NOTE — Telephone Encounter (Signed)
Spoke with patient. Patient states that she looked online at imaging locations that are covered under her insurance plan. States she does not see [facility] listed but sees [facility] phone [phone]. Advised patient [facility] is a [facility] of [facility]. Patient states she could not search by address. Will contact her insurance company to see if the [facility] is a covered location since this falls under [facility] as well. Asking if she may go ahead and schedule her IUD removal and reinsertion as her insurance coverage will end in 2 weeks. Skyla removal and Kyleena insertion scheduled for 04/12/2017 at 2:30 pm with Dr.Silva. Patient is agreeable to date and time. Pre procedure instructions given.  Motrin instructions given. Motrin=Advil=Ibuprofen, 800 mg one hour before appointment. Eat a meal and hydrate well before appointment.   Routing to provider for final review. Patient agreeable to disposition. Will close encounter.

## 2017-03-31 NOTE — Telephone Encounter (Signed)
I recommend Cytotec 200 mcg pv at hs the night prior to insertion and then 200 mcg pv the am of the day of insertion.

## 2017-03-31 NOTE — Telephone Encounter (Signed)
Patient is scheduled to have a mammogram done at the [facility] but they are not in Network with her insurance. Would like a referral to the medical diagnostic imaging center on [address] at [phone].

## 2017-03-31 NOTE — Telephone Encounter (Signed)
Spoke with patient. Cytotec instructions given. Place 1 tablet PV night before the procedure. Place 1 tablet PV the morning of the procedure. Rx for cytotec 200 mcg #2 0RF sent to pharmacy on file. Patient is already scheduled for PUS on 04/06/2017 at 9:30 am with 10 am consult with Dr.Silva.

## 2017-04-03 DIAGNOSIS — M545 Low back pain: Secondary | ICD-10-CM | POA: Diagnosis not present

## 2017-04-03 DIAGNOSIS — M25551 Pain in right hip: Secondary | ICD-10-CM | POA: Diagnosis not present

## 2017-04-03 DIAGNOSIS — M2560 Stiffness of unspecified joint, not elsewhere classified: Secondary | ICD-10-CM | POA: Diagnosis not present

## 2017-04-03 DIAGNOSIS — R293 Abnormal posture: Secondary | ICD-10-CM | POA: Diagnosis not present

## 2017-04-04 DIAGNOSIS — R293 Abnormal posture: Secondary | ICD-10-CM | POA: Diagnosis not present

## 2017-04-04 DIAGNOSIS — M545 Low back pain: Secondary | ICD-10-CM | POA: Diagnosis not present

## 2017-04-04 DIAGNOSIS — M2560 Stiffness of unspecified joint, not elsewhere classified: Secondary | ICD-10-CM | POA: Diagnosis not present

## 2017-04-04 DIAGNOSIS — M25551 Pain in right hip: Secondary | ICD-10-CM | POA: Diagnosis not present

## 2017-04-05 ENCOUNTER — Ambulatory Visit
Admission: RE | Admit: 2017-04-05 | Discharge: 2017-04-05 | Disposition: A | Discharge status: Home / Self Care | Payer: BLUE CROSS/BLUE SHIELD | Source: Ambulatory Visit | Attending: Obstetrics and Gynecology | Admitting: Obstetrics and Gynecology

## 2017-04-05 DIAGNOSIS — M545 Low back pain: Secondary | ICD-10-CM | POA: Diagnosis not present

## 2017-04-05 DIAGNOSIS — M25551 Pain in right hip: Secondary | ICD-10-CM | POA: Diagnosis not present

## 2017-04-05 DIAGNOSIS — N6452 Nipple discharge: Secondary | ICD-10-CM

## 2017-04-05 DIAGNOSIS — Z803 Family history of malignant neoplasm of breast: Secondary | ICD-10-CM

## 2017-04-05 DIAGNOSIS — M2560 Stiffness of unspecified joint, not elsewhere classified: Secondary | ICD-10-CM | POA: Diagnosis not present

## 2017-04-05 DIAGNOSIS — N6489 Other specified disorders of breast: Secondary | ICD-10-CM | POA: Diagnosis not present

## 2017-04-05 DIAGNOSIS — R293 Abnormal posture: Secondary | ICD-10-CM | POA: Diagnosis not present

## 2017-04-05 NOTE — Telephone Encounter (Signed)
Spoke with pt advised that unless she ha a complicated case most rheumatologist don't manage fibromyalgia.  Most patients have to go to the [facility] at [facility] at [phone] and the referral has been placed.  Pt advises they called with an appt but her insurance will be ending in 2 weeks due her daughter changing jobs so she will have to get back with [place] on this referral as she will not be able to go.  Advised I will inform provider.  Appreciative of appt and our help.

## 2017-04-06 ENCOUNTER — Ambulatory Visit (INDEPENDENT_AMBULATORY_CARE_PROVIDER_SITE_OTHER): Payer: BLUE CROSS/BLUE SHIELD | Admitting: Obstetrics and Gynecology

## 2017-04-06 ENCOUNTER — Encounter: Payer: Self-pay | Admitting: Obstetrics and Gynecology

## 2017-04-06 ENCOUNTER — Ambulatory Visit (INDEPENDENT_AMBULATORY_CARE_PROVIDER_SITE_OTHER): Payer: BLUE CROSS/BLUE SHIELD

## 2017-04-06 VITALS — BP 100/70 | HR 60 | Resp 14 | Ht 67.5 in | Wt 151.0 lb

## 2017-04-06 DIAGNOSIS — R102 Pelvic and perineal pain: Secondary | ICD-10-CM

## 2017-04-06 DIAGNOSIS — N946 Dysmenorrhea, unspecified: Secondary | ICD-10-CM | POA: Diagnosis not present

## 2017-04-06 DIAGNOSIS — Z30431 Encounter for routine checking of intrauterine contraceptive device: Secondary | ICD-10-CM | POA: Diagnosis not present
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
## 2017-04-10 DIAGNOSIS — M2560 Stiffness of unspecified joint, not elsewhere classified: Secondary | ICD-10-CM | POA: Diagnosis not present

## 2017-04-10 DIAGNOSIS — R293 Abnormal posture: Secondary | ICD-10-CM | POA: Diagnosis not present

## 2017-04-10 DIAGNOSIS — M545 Low back pain: Secondary | ICD-10-CM | POA: Diagnosis not present

## 2017-04-10 DIAGNOSIS — M25551 Pain in right hip: Secondary | ICD-10-CM | POA: Diagnosis not present

## 2017-04-11 ENCOUNTER — Telehealth: Payer: Self-pay

## 2017-04-11 ENCOUNTER — Other Ambulatory Visit: Payer: Self-pay | Admitting: *Deleted

## 2017-04-11 DIAGNOSIS — M25551 Pain in right hip: Secondary | ICD-10-CM | POA: Diagnosis not present

## 2017-04-11 DIAGNOSIS — R293 Abnormal posture: Secondary | ICD-10-CM | POA: Diagnosis not present

## 2017-04-11 DIAGNOSIS — M545 Low back pain: Secondary | ICD-10-CM | POA: Diagnosis not present

## 2017-04-11 DIAGNOSIS — M2560 Stiffness of unspecified joint, not elsewhere classified: Secondary | ICD-10-CM | POA: Diagnosis not present

## 2017-04-11 DIAGNOSIS — N6452 Nipple discharge: Secondary | ICD-10-CM

## 2017-04-11 DIAGNOSIS — Z30432 Encounter for removal of intrauterine contraceptive device: Secondary | ICD-10-CM

## 2017-04-11 DIAGNOSIS — Z30430 Encounter for insertion of intrauterine contraceptive device: Secondary | ICD-10-CM

## 2017-04-11 NOTE — Telephone Encounter (Signed)
Left [phone] call Cymone Yeske at [phone].  Notes recorded by Patton Salles, MD on 04/10/2017 at 12:28 PM EDT Please have patient proceed with right breast ultrasound.  I ordered bilateral ultrasounds due to bilateral nipple discharge and FH of breast cancer.  Patient is awaiting follow up on this from [place].  Thanks.  ------  Notes recorded by Ginny Forth, RN on 04/10/2017 at 11:27 AM EDT Sp[facility] with the [facility]. Patient only had left breast ultrasound. Would you like right breast ultrasound scheduled at this time? ------  Notes recorded by Patton Salles, MD on 04/05/2017 at 1:18 PM EDT Please contact the [facility].  I ordered a bilateral ultrasound for her bilateral nipple discharge and FH of breast cancer.  Did they do an ultrasound of the right breast as well?

## 2017-04-11 NOTE — Telephone Encounter (Signed)
Spoke with patient. Advised of recommendations as seen below from Dr.Silva. Patient verbalizes understanding. Would like to proceed with scheduling. Requests late afternoon appointment after 3 pm.  Left message to call Kaitlyn at [phone].  Need to advise patient appointment is scheduled for R breast ultrasound on 04/25/2017 at 2:50 pm at the [facility]. This is the soonest appointment option for late afternoon.

## 2017-04-11 NOTE — Telephone Encounter (Signed)
Patient returned call to Kaitlyn. °

## 2017-04-11 NOTE — Telephone Encounter (Signed)
Spoke with patient. Advised of appointment date and time as seen below. Patient verbalizes understanding. Continued mammogram hold.  Routing to provider for final review. Patient agreeable to disposition. Will close encounter.

## 2017-04-12 ENCOUNTER — Ambulatory Visit (INDEPENDENT_AMBULATORY_CARE_PROVIDER_SITE_OTHER): Payer: BLUE CROSS/BLUE SHIELD | Admitting: Obstetrics and Gynecology

## 2017-04-12 ENCOUNTER — Encounter: Payer: Self-pay | Admitting: Obstetrics and Gynecology

## 2017-04-12 VITALS — BP 100/58 | HR 90 | Ht 67.5 in | Wt 153.0 lb

## 2017-04-12 DIAGNOSIS — Z30432 Encounter for removal of intrauterine contraceptive device: Secondary | ICD-10-CM | POA: Diagnosis not present

## 2017-04-12 DIAGNOSIS — Z01812 Encounter for preprocedural laboratory examination: Secondary | ICD-10-CM

## 2017-04-12 DIAGNOSIS — Z30430 Encounter for insertion of intrauterine contraceptive device: Secondary | ICD-10-CM

## 2017-04-12 LAB — POCT URINE PREGNANCY: Preg Test, Ur: NEGATIVE

## 2017-04-12 NOTE — Patient Instructions (Signed)
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
## 2017-04-17 DIAGNOSIS — M545 Low back pain: Secondary | ICD-10-CM | POA: Diagnosis not present

## 2017-04-17 DIAGNOSIS — R293 Abnormal posture: Secondary | ICD-10-CM | POA: Diagnosis not present

## 2017-04-17 DIAGNOSIS — M2560 Stiffness of unspecified joint, not elsewhere classified: Secondary | ICD-10-CM | POA: Diagnosis not present

## 2017-04-17 DIAGNOSIS — M25551 Pain in right hip: Secondary | ICD-10-CM | POA: Diagnosis not present

## 2017-04-19 DIAGNOSIS — M25551 Pain in right hip: Secondary | ICD-10-CM | POA: Diagnosis not present

## 2017-04-19 DIAGNOSIS — M2560 Stiffness of unspecified joint, not elsewhere classified: Secondary | ICD-10-CM | POA: Diagnosis not present

## 2017-04-19 DIAGNOSIS — M545 Low back pain: Secondary | ICD-10-CM | POA: Diagnosis not present

## 2017-04-19 DIAGNOSIS — R293 Abnormal posture: Secondary | ICD-10-CM | POA: Diagnosis not present

## 2017-04-20 ENCOUNTER — Encounter: Payer: Self-pay | Admitting: Genetic Counselor

## 2017-04-20 ENCOUNTER — Ambulatory Visit (HOSPITAL_BASED_OUTPATIENT_CLINIC_OR_DEPARTMENT_OTHER): Payer: BLUE CROSS/BLUE SHIELD | Admitting: Genetic Counselor

## 2017-04-20 ENCOUNTER — Ambulatory Visit: Payer: BLUE CROSS/BLUE SHIELD

## 2017-04-20 DIAGNOSIS — Z803 Family history of malignant neoplasm of breast: Secondary | ICD-10-CM | POA: Diagnosis not present

## 2017-04-20 DIAGNOSIS — Z7183 Encounter for nonprocreative genetic counseling: Secondary | ICD-10-CM | POA: Diagnosis not present

## 2017-04-20 NOTE — Progress Notes (Addendum)
[facility]      Initial Visit   Patient Name: Kathy Cole Patient DOB: 1995-11-05 Patient Age: 21 y.o. Encounter Date: 04/20/2017  Referring Provider: Nunzio Cole , MD  Primary Care Provider: Forrest Moron, MD  Reason for Visit: Evaluate for hereditary susceptibility to cancer    Assessment and Plan:  . Kathy Cole's family history is not suggestive of a hereditary predisposition to cancer. However, her father's side of the family is very small and he had no sisters. This paucity of women makes risk assessment challenging.   . Testing is recommended to determine whether she has a pathogenic mutation that will impact her screening and risk-reduction for cancer. A negative result will be reassuring.  . Kathy Cole wished to pursue genetic testing and a blood sample will be sent for analysis of the 46 genes on [facility] Common Cancers panel (APC, ATM, AXIN2, BARD1, BMPR1A, BRCA1, BRCA2, BRIP1, CDH1, CDK4, CDKN2A, CHEK2, CTNNA1, DICER1, EPCAM, GREM1, HOXB13, KIT, MEN1, MLH1, MSH2, MSH3, MSH6, MUTYH, NBN, NF1, NTHL1, PALB2, PDGFRA, PMS2, POLD1, POLE, PTEN, RAD50, RAD51C, RAD51D, SDHA, SDHB, SDHC, SDHD, SMAD4, SMARCA4, STK11, TP53, TSC1, TSC2, VHL). ADDENDUM: Patient canceled testing due to out-of-pocket cost of $250.  Marland Kitchen Results should be available in approximately 2-4 weeks, at which point we will contact her and address implications for her as well as address genetic testing for at-risk family members, if needed.     Dr. Jana Cole was available for questions concerning this case. Total time spent by me in face-to-face counseling was approximately 35 minutes.   _____________________________________________________________________   History of Present Illness: [name], a 21 y.o. female, is being seen at the [facility] due to a remote family history of cancer. She presents to clinic today to discuss the possibility of a  hereditary predisposition to cancer and discuss whether genetic testing is warranted.  Kathy Cole has no personal history of cancer. She is being worked up for bilateral nipple discharge.  Past Medical History:  Diagnosis Date  . Anxiety   . Bipolar depression ([place])   . Depression   . Erosive gastropathy   . Family history of breast cancer   . Fibromyalgia   . IBS (irritable bowel syndrome)     Past Surgical History:  Procedure Laterality Date  . GANGLION CYST EXCISION      Social History   Social History  . Marital status: Single    Spouse name: N/A  . Number of children: N/A  . Years of education: N/A   Occupational History  . Student    Social History Main Topics  . Smoking status: Current Some Day Smoker    Packs/day: 0.25    Years: 0.00    Types: Cigarettes  . Smokeless tobacco: Never Used     Comment: also has an E cigarette  . Alcohol use No  . Drug use: Yes    Types: Marijuana     Comment: THC once month 1/8th gram  . Sexual activity: Yes    Birth control/ protection: IUD     Comment: Skyla - inserted December 2015   Other Topics Concern  . Not on file   Social History Narrative  . No narrative on file     Family History:  During the visit, a 4-generation pedigree was obtained. Family tree will be scanned in the Media tab in Epic  Significant diagnoses include the following:  Family  History  Problem Relation Age of Onset  . Depression Mother   . Anxiety disorder Mother   . Other Mother        "pre-cancerous" breast cells  . Colon polyps Father   . Alcoholism Father   . Hypertension Father   . Irritable bowel syndrome Sister   . Stroke Paternal Grandfather   . Heart Problems Paternal Grandfather   . Breast cancer Other        mother's paternal aunt and paternal grandmother  . Breast cancer Other        father's maternal grandmother    Additionally, Kathy Cole has no children. She has one full sister (age 36), a maternal half-brother  (age 80) and a paternal half-sister (age 21). Her mother (age 72) has a brother and a sister. Her father (age 18) has only one brother.  Kathy Cole ancestry is Caucasian - NOS. There is no known Jewish ancestry and no consanguinity.  Discussion: We reviewed the characteristics, features and inheritance patterns of hereditary cancer syndromes. We discussed her risk of harboring a mutation in the context of her personal and family history. We discussed that her small paternal family and paucity of women makes risk assessment challenging. We discussed the process of genetic testing, insurance coverage and implications of results: positive, negative and variant of unknown significance (VUS).    Kathy Cole questions were answered to her satisfaction today and she is welcome to call with any additional questions or concerns. Thank you for the referral and allowing [place] to share in the care of your patient.    Kathy Berg, MS, [place] Certified Genetic Counselor phone: [phone] [email or web address]   ______________________________________________________________________ For Office Staff:  Number of people involved in session: 1 Was an Intern/ student involved with case: no

## 2017-04-24 DIAGNOSIS — M2560 Stiffness of unspecified joint, not elsewhere classified: Secondary | ICD-10-CM | POA: Diagnosis not present

## 2017-04-24 DIAGNOSIS — M545 Low back pain: Secondary | ICD-10-CM | POA: Diagnosis not present

## 2017-04-24 DIAGNOSIS — R293 Abnormal posture: Secondary | ICD-10-CM | POA: Diagnosis not present

## 2017-04-24 DIAGNOSIS — M25551 Pain in right hip: Secondary | ICD-10-CM | POA: Diagnosis not present

## 2017-04-25 ENCOUNTER — Ambulatory Visit
Admission: RE | Admit: 2017-04-25 | Discharge: 2017-04-25 | Disposition: A | Discharge status: Home / Self Care | Payer: BLUE CROSS/BLUE SHIELD | Source: Ambulatory Visit | Attending: Obstetrics and Gynecology | Admitting: Obstetrics and Gynecology

## 2017-04-25 DIAGNOSIS — N6452 Nipple discharge: Secondary | ICD-10-CM

## 2017-04-25 DIAGNOSIS — N6489 Other specified disorders of breast: Secondary | ICD-10-CM | POA: Diagnosis not present

## 2017-04-26 DIAGNOSIS — R293 Abnormal posture: Secondary | ICD-10-CM | POA: Diagnosis not present

## 2017-04-26 DIAGNOSIS — M2560 Stiffness of unspecified joint, not elsewhere classified: Secondary | ICD-10-CM | POA: Diagnosis not present

## 2017-04-26 DIAGNOSIS — M545 Low back pain: Secondary | ICD-10-CM | POA: Diagnosis not present

## 2017-04-26 DIAGNOSIS — M25551 Pain in right hip: Secondary | ICD-10-CM | POA: Diagnosis not present

## 2017-04-28 ENCOUNTER — Encounter: Payer: Self-pay | Admitting: Obstetrics and Gynecology

## 2017-04-28 ENCOUNTER — Ambulatory Visit (INDEPENDENT_AMBULATORY_CARE_PROVIDER_SITE_OTHER): Payer: BLUE CROSS/BLUE SHIELD | Admitting: Obstetrics and Gynecology

## 2017-04-28 VITALS — BP 112/60 | HR 88 | Resp 16 | Wt 150.0 lb

## 2017-04-28 DIAGNOSIS — Z30431 Encounter for routine checking of intrauterine contraceptive device: Secondary | ICD-10-CM | POA: Diagnosis not present
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
## 2017-05-01 ENCOUNTER — Encounter: Payer: Self-pay | Admitting: Family Medicine

## 2017-05-01 ENCOUNTER — Ambulatory Visit (INDEPENDENT_AMBULATORY_CARE_PROVIDER_SITE_OTHER): Payer: BLUE CROSS/BLUE SHIELD | Admitting: Family Medicine

## 2017-05-01 ENCOUNTER — Other Ambulatory Visit: Payer: Self-pay | Admitting: Family Medicine

## 2017-05-01 VITALS — BP 110/60 | HR 59 | Temp 97.7°F | Resp 17 | Ht 67.5 in | Wt 156.2 lb

## 2017-05-01 DIAGNOSIS — K580 Irritable bowel syndrome with diarrhea: Secondary | ICD-10-CM | POA: Diagnosis not present

## 2017-05-01 DIAGNOSIS — M797 Fibromyalgia: Secondary | ICD-10-CM | POA: Diagnosis not present

## 2017-05-01 DIAGNOSIS — M545 Low back pain: Secondary | ICD-10-CM | POA: Diagnosis not present

## 2017-05-01 DIAGNOSIS — Z760 Encounter for issue of repeat prescription: Secondary | ICD-10-CM

## 2017-05-01 DIAGNOSIS — R293 Abnormal posture: Secondary | ICD-10-CM | POA: Diagnosis not present

## 2017-05-01 DIAGNOSIS — M2560 Stiffness of unspecified joint, not elsewhere classified: Secondary | ICD-10-CM | POA: Diagnosis not present

## 2017-05-01 DIAGNOSIS — M25551 Pain in right hip: Secondary | ICD-10-CM | POA: Diagnosis not present

## 2017-05-01 DIAGNOSIS — F3130 Bipolar disorder, current episode depressed, mild or moderate severity, unspecified: Secondary | ICD-10-CM

## 2017-05-01 MED ORDER — ONDANSETRON 4 MG PO TBDP: 4.0000 mg | ORAL_TABLET | Freq: Four times a day (QID) | ORAL | 3 refills | Status: DC | PRN

## 2017-05-01 MED ORDER — DICYCLOMINE HCL 10 MG PO CAPS
10.0000 mg | ORAL_CAPSULE | ORAL | 2 refills | Status: DC
Start: 2017-05-01 — End: 2018-09-18

## 2017-05-01 MED ORDER — LAMOTRIGINE 100 MG PO TABS: 50.0000 mg | ORAL_TABLET | Freq: Two times a day (BID) | ORAL | 0 refills | Status: DC

## 2017-05-01 MED ORDER — CLONAZEPAM 0.5 MG PO TABS: 0.5000 mg | ORAL_TABLET | ORAL | 0 refills | Status: DC | PRN

## 2017-05-01 NOTE — Progress Notes (Signed)
60

## 2017-05-01 NOTE — Patient Instructions (Addendum)
   IF you received an x-ray today, you will receive an invoice from [facility]. Please contact [facility] at [phone] with questions or concerns regarding your invoice.   IF you received labwork today, you will receive an invoice from [facility]. Please contact [facility] at [phone] with questions or concerns regarding your invoice.   Our billing staff will not be able to assist you with questions regarding bills from these companies.  You will be contacted with the lab results as soon as they are available. The fastest way to get your results is to activate your My Chart account. Instructions are located on the last page of this paperwork. If you have not heard from us regarding the results in 2 weeks, please contact this office.     Probiotics What are probiotics? Probiotics are the good bacteria and yeasts that live in your body and keep you and your digestive system healthy. Probiotics also help your body's defense (immune) system and protect your body against bad bacterial growth. Certain foods contain probiotics, such as yogurt. Probiotics can also be purchased as a supplement. As with any supplement or drug, it is important to discuss its use with your health care provider. What affects the balance of bacteria in my body? The balance of bacteria in your body can be affected by:  Antibiotic medicines. Antibiotics are sometimes necessary to treat infection. Unfortunately, they may kill good or friendly bacteria in your body as well as the bad bacteria. This may lead to stomach problems like diarrhea, gas, and cramping.  Disease. Some conditions are the result of an overgrowth of bad bacteria, yeasts, parasites, or fungi. These conditions include:  Infectious diarrhea.  Stomach and respiratory infections.  Skin infections.  Irritable bowel syndrome (IBS).  Inflammatory bowel diseases.  Ulcer due to Helicobacter pylori (H. pylori) infection.  Tooth  decay and periodontal disease.  Vaginal infections. Stress and poor diet may also lower the good bacteria in your body. What type of probiotic is right for me? Probiotics are available over the counter at your local pharmacy, health food, or grocery store. They come in many different forms, combinations of strains, and dosing strengths. Some may need to be refrigerated. Always read the label for storage and usage instructions. Specific strains have been shown to be more effective for certain conditions. Ask your health care provider what option is best for you. Why would I need probiotics? There are many reasons your health care provider might recommend a probiotic supplement, including:  Diarrhea.  Constipation.  IBS.  Respiratory infections.  Yeast infections.  Acne, eczema, and other skin conditions.  Frequent urinary tract infections (UTIs). Are there side effects of probiotics? Some people experience mild side effects when taking probiotics. Side effects are usually temporary and may include:  Gas.  Bloating.  Cramping. Rarely, serious side effects, such as infection or immune system changes, may occur. What else do I need to know about probiotics?  There are many different strains of probiotics. Certain strains may be more effective depending on your condition. Probiotics are available in varying doses. Ask your health care provider which probiotic you should use and how often.  If you are taking probiotics along with antibiotics, it is generally recommended to wait at least 2 hours between taking the antibiotic and taking the probiotic. For more information: National Center for Complementary and Alternative Medicine http://nccam.nih.gov/ This information is not intended to replace advice given to you by your health care provider. Make sure you   sure you discuss any questions you have with your health care provider. Document Released: 01/22/2014 Document Revised: 05/24/2016  Document Reviewed: 09/24/2013 Elsevier Interactive Patient Education  2017 [employer]

## 2017-05-01 NOTE — Progress Notes (Signed)
Chief Complaint  Patient presents with  . Medication Refill    gabapentin, clonazepam, bentyl, odansetron    HPI  Pt is here for medication refills for her fibromyalgia and anxiety Her insurance is changing She reports that she has been taking gabapentin for her fibromyalgia  She takes bentyl for her IBS She denies any side effects She gets mostly diarrhea She states that she takes it depending on how back her diarrhea is. She typically takes it about once or twice a day -  She denies constipation, nausea or vomiting -  She denies dizziness or palpitations -  She denies headaches or blurry vision  Bipolar Disorder She has been pretty stable on her lamictal for 4 months now and has been doing well She has not had to take any clonazepam since July  She would like a refill of her clonazepam She sees Psychiatrist Dolphus Jennyracy [place] She reports that she is doing much better with her current doses [facility] 05/01/2017 03/09/2017 12/30/2016 12/09/2016 11/24/2016  Decreased Interest 0 1 - 0 0  Down, Depressed, Hopeless 0 1 3 0 0  PHQ - 2 Score 0 2 3 0 0  Altered sleeping - 2 2 - -  Tired, decreased energy - 1 2 - -  Change in appetite - 0 0 - -  Feeling bad or failure about yourself  - 0 0 - -  Trouble concentrating - 1 2 - -  Moving slowly or fidgety/restless - 1 3 - -  Suicidal thoughts - 0 0 - -  PHQ-9 Score - 7 12 - -  Difficult doing work/chores - - Extremely dIfficult - -     Past Medical History:  Diagnosis Date  . Anxiety   . Bipolar depression (HCC)   . Depression   . Erosive gastropathy   . Family history of breast cancer   . Fibromyalgia   . IBS (irritable bowel syndrome)     Current Outpatient Prescriptions  Medication Sig Dispense Refill  . clonazePAM (KLONOPIN) 0.5 MG tablet Take 1 tablet (0.5 mg total) by mouth as needed for anxiety. 30 tablet 0  . dicyclomine (BENTYL) 10 MG capsule Take 1 capsule (10 mg total) by mouth as directed. 2-3 times  daily before meals 45 capsule 2  . gabapentin (NEURONTIN) 300 MG capsule Take 1 capsule (300 mg total) by mouth 3 (three) times daily. (Patient taking differently: Take 300 mg by mouth as needed. ) 90 capsule 11  . lamoTRIgine (LAMICTAL) 100 MG tablet Take 50 mg by mouth 2 (two) times daily.    . Levonorgestrel (KYLEENA) 19.5 MG IUD by Intrauterine route.    . Multiple Vitamin (MULTI VITAMIN DAILY PO) Take by mouth.    . ondansetron (ZOFRAN ODT) 4 MG disintegrating tablet Take 1 tablet (4 mg total) by mouth every 6 (six) hours as needed for nausea or vomiting. 45 tablet 3  . Probiotic Product (PROBIOTIC-10 PO) Take by mouth.     No current facility-administered medications for this visit.     Allergies: No Known Allergies  Past Surgical History:  Procedure Laterality Date  . GANGLION CYST EXCISION      Social History   Social History  . Marital status: Single    Spouse name: N/A  . Number of children: N/A  . Years of education: N/A   Occupational History  . Student    Social History Main Topics  . Smoking status: Current Some Day Smoker    Packs/day: 0.25  Years: 0.00    Types: Cigarettes  . Smokeless tobacco: Never Used     Comment: also has an E cigarette  . Alcohol use No  . Drug use: Yes    Types: Marijuana     Comment: THC once month 1/8th gram  . Sexual activity: Yes    Birth control/ protection: IUD     Comment: Kyleena    Other Topics Concern  . None   Social History Narrative  . None    Family History  Problem Relation Age of Onset  . Depression Mother   . Anxiety disorder Mother   . Other Mother        "pre-cancerous" breast cells  . Colon polyps Father   . Alcoholism Father   . Hypertension Father   . Irritable bowel syndrome Sister   . Stroke Paternal Grandfather   . Heart Problems Paternal Grandfather   . Breast cancer Other        mother's paternal aunt and paternal grandmother  . Breast cancer Other        father's maternal  grandmother     ROS Review of Systems See HPI Constitution: No fevers or chills No malaise No diaphoresis Skin: No rash or itching Eyes: no blurry vision, no double vision GU: no dysuria or hematuria Neuro: no dizziness or headaches all others reviewed and negative   Objective: Vitals:   05/01/17 0851  BP: 110/60  Pulse: (!) 59  Resp: 17  Temp: 97.7 F (36.5 C)  TempSrc: Oral  SpO2: 99%  Weight: 156 lb 3.2 oz (70.9 kg)  Height: 5' 7.5" (1.715 m)    Physical Exam  Constitutional: She is oriented to person, place, and time. She appears well-developed and well-nourished.  HENT:  Head: Normocephalic and atraumatic.  Eyes: Conjunctivae and EOM are normal.  Cardiovascular: Normal rate, regular rhythm and normal heart sounds.   Pulmonary/Chest: Effort normal and breath sounds normal. No respiratory distress. She has no wheezes.  Abdominal: Soft. Bowel sounds are normal. She exhibits no distension. There is no tenderness. There is no guarding.  Neurological: She is alert and oriented to person, place, and time.  Psychiatric: She has a normal mood and affect. Her behavior is normal. Judgment and thought content normal.    Assessment and Plan Alex was seen today for medication refill.  Diagnoses and all orders for this visit:  Irritable bowel syndrome with diarrhea Refilled bentyl No side effects  Encounter for medication refill Discussed refills   Fibromyalgia Pt has plenty of gabapentin Continue as instructed  Bipolar affective disorder, current episode depressed, current episode severity unspecified (HCC) Unable to log into [facility] Gave 30 tabs of clozapem She should follow up with psychology  Other orders -     Cancel: Flu Vaccine QUAD 36+ mos IM -     dicyclomine (BENTYL) 10 MG capsule; Take 1 capsule (10 mg total) by mouth as directed. 2-3 times daily before meals -     clonazePAM (KLONOPIN) 0.5 MG tablet; Take 1 tablet (0.5 mg total) by mouth as needed  for anxiety. -     ondansetron (ZOFRAN ODT) 4 MG disintegrating tablet; Take 1 tablet (4 mg total) by mouth every 6 (six) hours as needed for nausea or vomiting.     Maryama Kuriakose A Raelle Chambers

## 2017-05-03 DIAGNOSIS — M545 Low back pain: Secondary | ICD-10-CM | POA: Diagnosis not present

## 2017-05-03 DIAGNOSIS — R293 Abnormal posture: Secondary | ICD-10-CM | POA: Diagnosis not present

## 2017-05-03 DIAGNOSIS — M25551 Pain in right hip: Secondary | ICD-10-CM | POA: Diagnosis not present

## 2017-05-03 DIAGNOSIS — M2560 Stiffness of unspecified joint, not elsewhere classified: Secondary | ICD-10-CM | POA: Diagnosis not present

## 2017-05-08 DIAGNOSIS — R293 Abnormal posture: Secondary | ICD-10-CM | POA: Diagnosis not present

## 2017-05-08 DIAGNOSIS — M545 Low back pain: Secondary | ICD-10-CM | POA: Diagnosis not present

## 2017-05-08 DIAGNOSIS — M62838 Other muscle spasm: Secondary | ICD-10-CM | POA: Diagnosis not present

## 2017-05-08 DIAGNOSIS — M9906 Segmental and somatic dysfunction of lower extremity: Secondary | ICD-10-CM | POA: Diagnosis not present

## 2017-05-08 DIAGNOSIS — M2560 Stiffness of unspecified joint, not elsewhere classified: Secondary | ICD-10-CM | POA: Diagnosis not present

## 2017-05-08 DIAGNOSIS — M25551 Pain in right hip: Secondary | ICD-10-CM | POA: Diagnosis not present

## 2017-05-10 DIAGNOSIS — R293 Abnormal posture: Secondary | ICD-10-CM | POA: Diagnosis not present

## 2017-05-10 DIAGNOSIS — M545 Low back pain: Secondary | ICD-10-CM | POA: Diagnosis not present

## 2017-05-10 DIAGNOSIS — M25551 Pain in right hip: Secondary | ICD-10-CM | POA: Diagnosis not present

## 2017-05-10 DIAGNOSIS — M2560 Stiffness of unspecified joint, not elsewhere classified: Secondary | ICD-10-CM | POA: Diagnosis not present

## 2017-05-12 NOTE — Progress Notes (Signed)
Patient canceled testing due to out-of-pocket cost of $250.

## 2017-06-05 ENCOUNTER — Other Ambulatory Visit: Payer: Self-pay | Admitting: Family Medicine

## 2017-07-17 ENCOUNTER — Other Ambulatory Visit: Payer: Self-pay | Admitting: Family Medicine

## 2017-07-28 ENCOUNTER — Ambulatory Visit: Payer: BLUE CROSS/BLUE SHIELD

## 2017-10-25 IMAGING — US US PELVIS COMPLETE
1 series · 14 of 25 positions shown · non-contrast
Comparison: None

CLINICAL DATA: Two month history of mid pelvic pain.

EXAM:
TRANSABDOMINAL AND TRANSVAGINAL ULTRASOUND OF PELVIS
TECHNIQUE: Both transabdominal and transvaginal ultrasound examinations of the
pelvis were performed. Transabdominal technique was performed for
global imaging of the pelvis including uterus, ovaries, adnexal
regions, and pelvic cul-de-sac. It was necessary to proceed with
endovaginal exam following the transabdominal exam to visualize the
ovaries and endometrium.

[Series 1: us pelvis complete · 0.24mm/px · 14 of 43 slices shown]
[im 1/43]
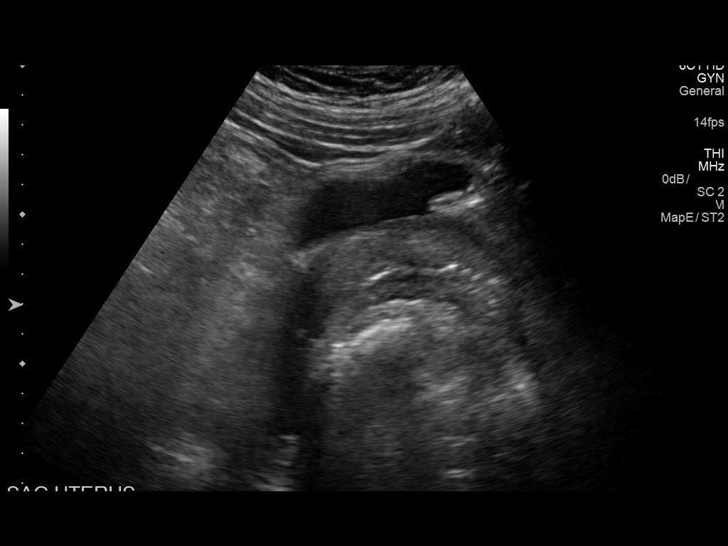
[im 4/43]
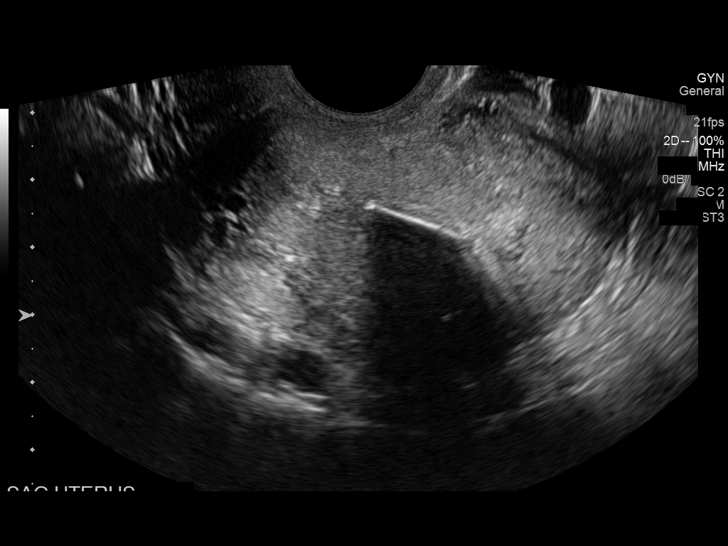
[im 8/43]
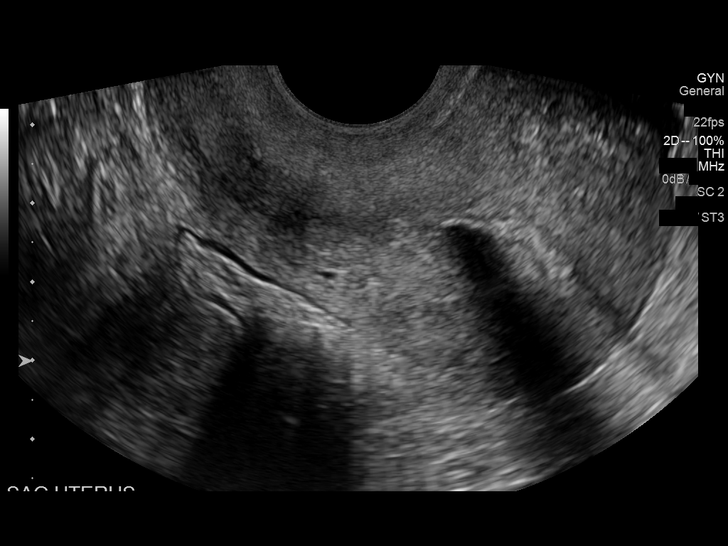
[im 11/43]
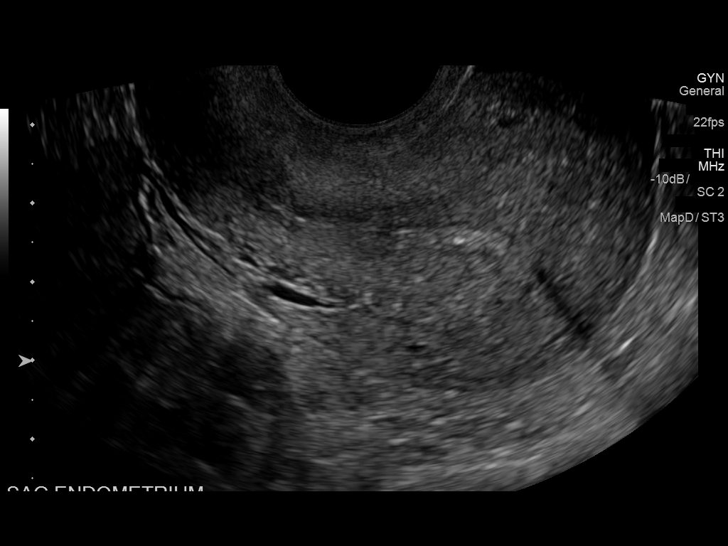
[im 15/43]
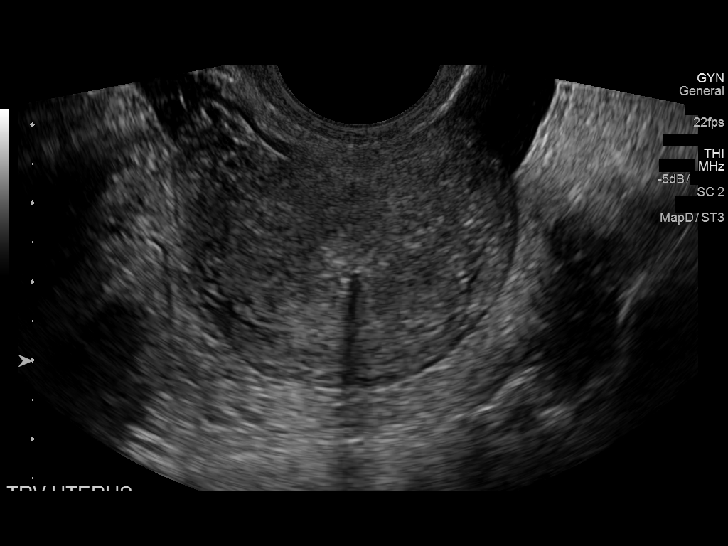
[im 16/43]
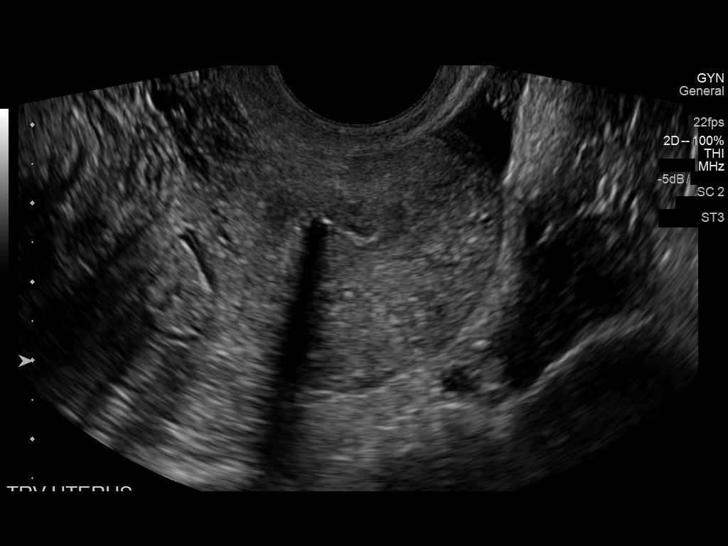
[im 20/43]
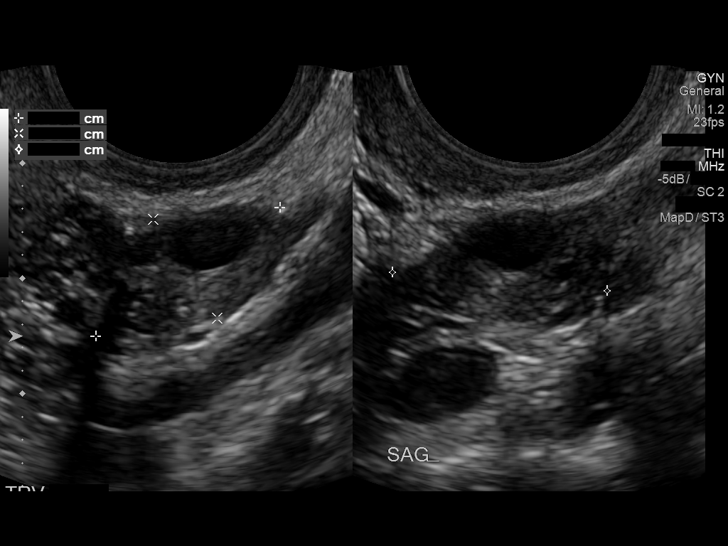
[im 23/43]
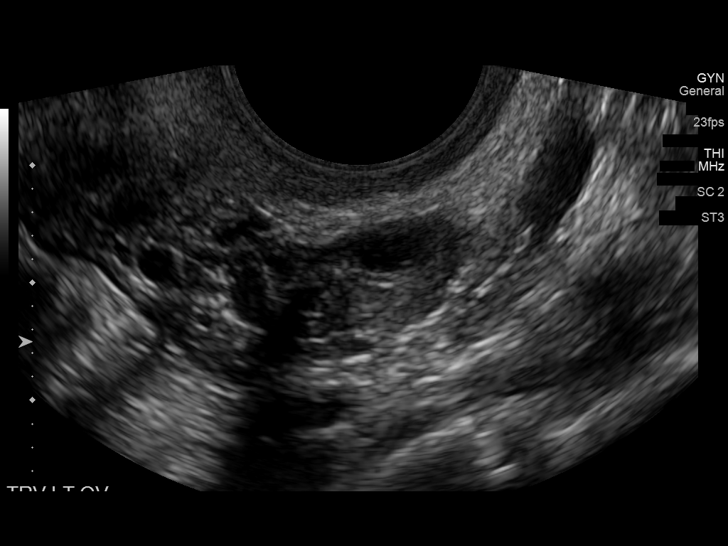
[im 27/43]
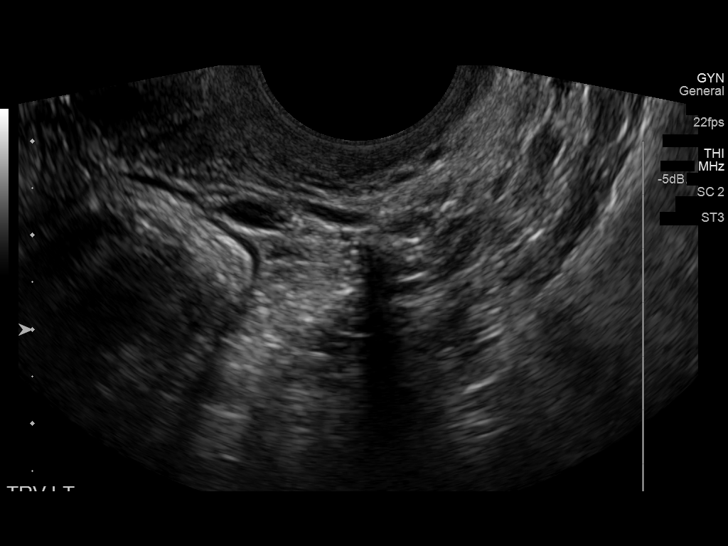
[im 29/43]
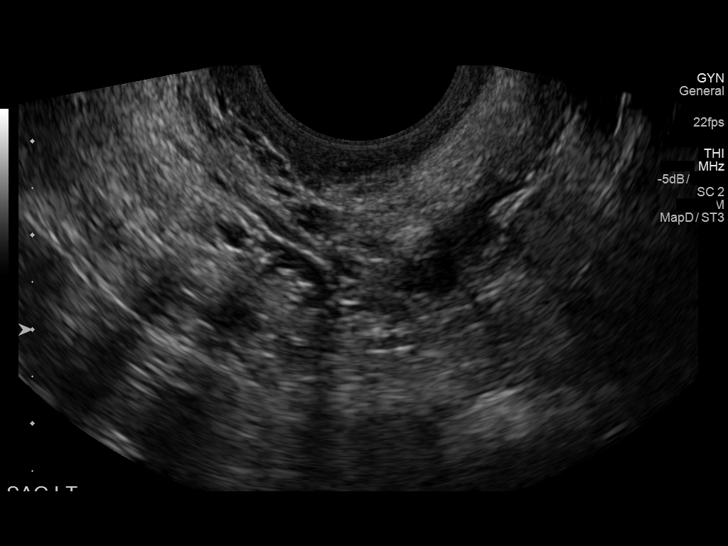
[im 32/43]
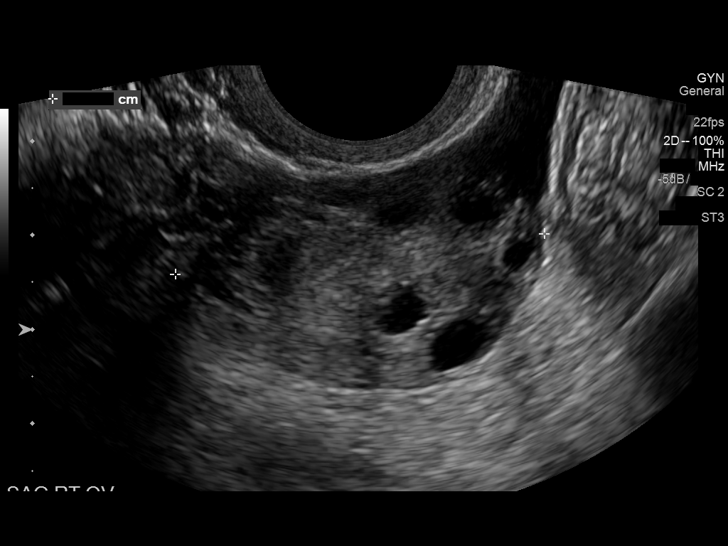
[im 36/43]
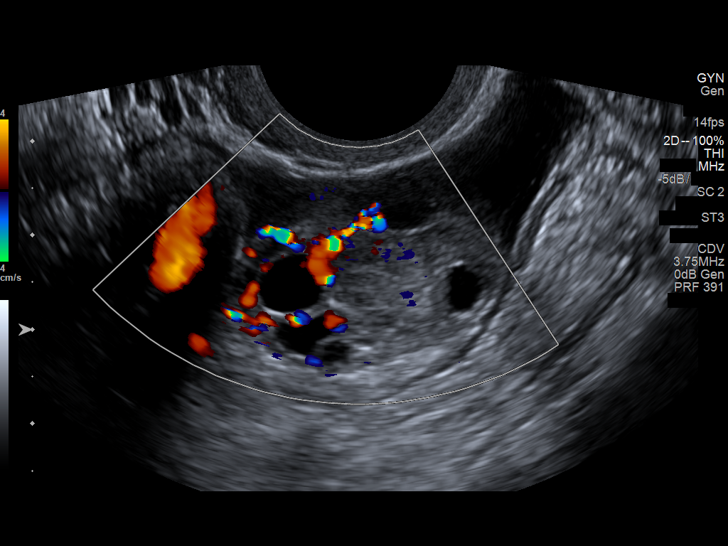
[im 39/43]
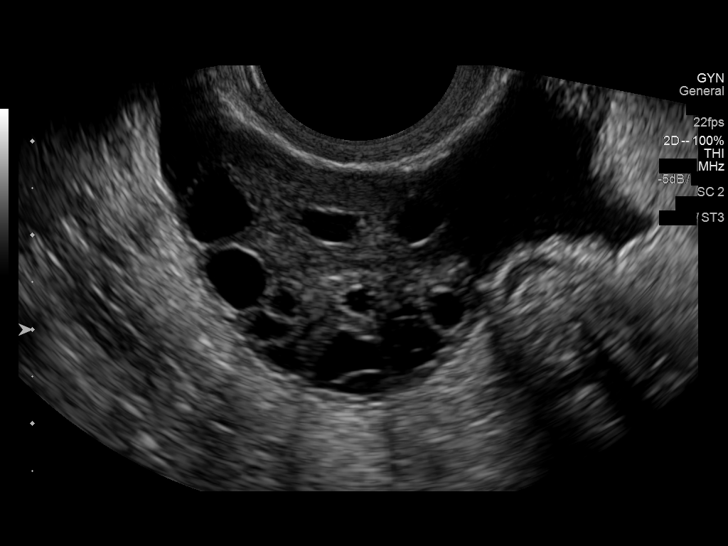
[im 43/43]
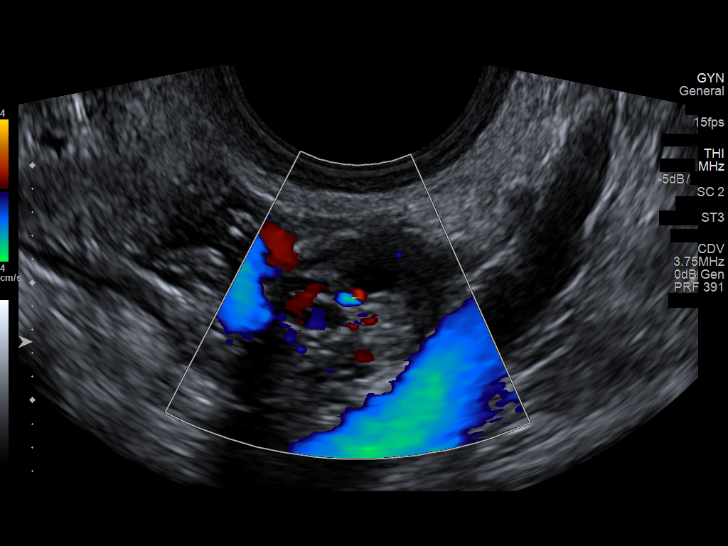

[14 of 25 positions shown; findings below may reference images not displayed]

FINDINGS: Uterus

Measurements: 7.4 x 4.7 x 4.2 cm. Retroverted position. No
myometrial abnormalities.

Endometrium

Thickness: 2 mm.  IUD noted in the endometrial canal.

Right ovary

Measurements: 4.1 x 2.5 x 4.0 cm. Normal appearance/no adnexal mass.

Left ovary

Measurements: 1.9 x 1.0 x 1.9 cm. Normal appearance/no adnexal mass.

Other findings

Trace amount of free pelvic fluid near the right ovary.
IMPRESSION: 1. Normal sonographic appearance of the uterus and ovaries.
2. IUD normally located the endometrial canal.

## 2017-10-26 ENCOUNTER — Emergency Department (HOSPITAL_COMMUNITY)
Admission: EM | Admit: 2017-10-26 | Discharge: 2017-10-26 | Disposition: A | Discharge status: Home / Self Care | Payer: BLUE CROSS/BLUE SHIELD | Attending: Emergency Medicine | Admitting: Emergency Medicine

## 2017-10-26 ENCOUNTER — Emergency Department (HOSPITAL_COMMUNITY): Payer: BLUE CROSS/BLUE SHIELD

## 2017-10-26 ENCOUNTER — Encounter (HOSPITAL_COMMUNITY): Payer: Self-pay | Admitting: *Deleted

## 2017-10-26 DIAGNOSIS — F17210 Nicotine dependence, cigarettes, uncomplicated: Secondary | ICD-10-CM | POA: Diagnosis not present

## 2017-10-26 DIAGNOSIS — R509 Fever, unspecified: Secondary | ICD-10-CM | POA: Diagnosis not present

## 2017-10-26 DIAGNOSIS — Z79899 Other long term (current) drug therapy: Secondary | ICD-10-CM | POA: Insufficient documentation

## 2017-10-26 DIAGNOSIS — R1031 Right lower quadrant pain: Secondary | ICD-10-CM | POA: Diagnosis not present

## 2017-10-26 DIAGNOSIS — R109 Unspecified abdominal pain: Secondary | ICD-10-CM | POA: Diagnosis not present

## 2017-10-26 DIAGNOSIS — R630 Anorexia: Secondary | ICD-10-CM | POA: Insufficient documentation

## 2017-10-26 DIAGNOSIS — R110 Nausea: Secondary | ICD-10-CM | POA: Insufficient documentation

## 2017-10-26 LAB — COMPREHENSIVE METABOLIC PANEL
ALT: 9 U/L — ABNORMAL LOW (ref 14–54)
AST: 13 U/L — ABNORMAL LOW (ref 15–41)
Albumin: 3.9 g/dL (ref 3.5–5.0)
Alkaline Phosphatase: 50 U/L (ref 38–126)
Anion gap: 9 (ref 5–15)
BUN: 7 mg/dL (ref 6–20)
CO2: 26 mmol/L (ref 22–32)
Calcium: 8.9 mg/dL (ref 8.9–10.3)
Chloride: 105 mmol/L (ref 101–111)
Creatinine, Ser: 0.62 mg/dL (ref 0.44–1.00)
GFR calc Af Amer: >60 mL/min (ref >60)
GFR calc non Af Amer: >60 mL/min (ref >60)
Glucose, Bld: 105 mg/dL — ABNORMAL HIGH (ref 65–99)
Potassium: 3.6 mmol/L (ref 3.5–5.1)
Sodium: 140 mmol/L (ref 135–145)
Total Bilirubin: 0.7 mg/dL (ref 0.3–1.2)
Total Protein: 7.1 g/dL (ref 6.5–8.1)

## 2017-10-26 LAB — CBC WITH DIFFERENTIAL/PLATELET
Basophils Absolute: 0 10*3/uL (ref 0.0–0.1)
Basophils Relative: 0 %
Eosinophils Absolute: 0.1 10*3/uL (ref 0.0–0.7)
Eosinophils Relative: 1 %
HCT: 36.4 % (ref 36.0–46.0)
Hemoglobin: 12.7 g/dL (ref 12.0–15.0)
Lymphocytes Relative: 8 %
Lymphs Abs: 0.9 10*3/uL (ref 0.7–4.0)
MCH: 31.2 pg (ref 26.0–34.0)
MCHC: 34.9 g/dL (ref 30.0–36.0)
MCV: 89.4 fL (ref 78.0–100.0)
Monocytes Absolute: 0.6 10*3/uL (ref 0.1–1.0)
Monocytes Relative: 5 %
Neutro Abs: 10.5 10*3/uL — ABNORMAL HIGH (ref 1.7–7.7)
Neutrophils Relative %: 86 %
Platelets: 200 10*3/uL (ref 150–400)
RBC: 4.07 MIL/uL (ref 3.87–5.11)
RDW: 12.3 % (ref 11.5–15.5)
WBC: 12.2 10*3/uL — ABNORMAL HIGH (ref 4.0–10.5)

## 2017-10-26 LAB — URINALYSIS, ROUTINE W REFLEX MICROSCOPIC
Bilirubin Urine: NEGATIVE
Glucose, UA: NEGATIVE mg/dL
Hgb urine dipstick: NEGATIVE
Ketones, ur: NEGATIVE mg/dL
Leukocytes, UA: NEGATIVE
Nitrite: NEGATIVE
Protein, ur: NEGATIVE mg/dL
Specific Gravity, Urine: 1.006 (ref 1.005–1.030)
pH: 8 (ref 5.0–8.0)

## 2017-10-26 LAB — I-STAT BETA HCG BLOOD, ED (MC, WL, AP ONLY): I-stat hCG, quantitative: 13.8 m[IU]/mL — ABNORMAL HIGH (ref <5)

## 2017-10-26 LAB — LIPASE, BLOOD: Lipase: 30 U/L (ref 11–51)

## 2017-10-26 MED ORDER — SODIUM CHLORIDE 0.9 % IJ SOLN
INTRAMUSCULAR | Status: AC
  Filled 2017-10-26: qty 50

## 2017-10-26 MED ORDER — IOPAMIDOL (ISOVUE-300) INJECTION 61%
100.0000 mL | Freq: Once | INTRAVENOUS | Status: AC | PRN
  Administered 2017-10-26: 100 mL via INTRAVENOUS

## 2017-10-26 MED ORDER — SODIUM CHLORIDE 0.9 % IV BOLUS
1000.0000 mL | Freq: Once | INTRAVENOUS | Status: AC
  Administered 2017-10-26: 1000 mL via INTRAVENOUS

## 2017-10-26 MED ORDER — FENTANYL CITRATE (PF) 100 MCG/2ML IJ SOLN
25.0000 ug | Freq: Once | INTRAMUSCULAR | Status: AC
  Administered 2017-10-26: 25 ug via INTRAVENOUS
  Filled 2017-10-26: qty 2

## 2017-10-26 MED ORDER — SODIUM CHLORIDE 0.9 % IV SOLN: INTRAVENOUS | Status: DC

## 2017-10-26 MED ORDER — IOPAMIDOL (ISOVUE-300) INJECTION 61%
INTRAVENOUS | Status: AC
  Filled 2017-10-26: qty 100

## 2017-10-26 MED ORDER — ONDANSETRON HCL 4 MG/2ML IJ SOLN
4.0000 mg | Freq: Once | INTRAMUSCULAR | Status: AC
  Administered 2017-10-26: 4 mg via INTRAVENOUS
  Filled 2017-10-26: qty 2

## 2017-10-26 NOTE — Discharge Instructions (Addendum)
As discussed, your evaluation today has been largely reassuring.  But, it is important that you monitor your condition carefully, and do not hesitate to return to the ED if you develop new, or concerning changes in your condition. ? ?Otherwise, please follow-up with your physician for appropriate ongoing care. ? ?

## 2017-10-26 NOTE — ED Triage Notes (Signed)
Pt complains of RLQ abdominal pain since last night. Pt denies n/v. Pt has hx of IBS, states she has not had change in bowel pattern. Pt had temp of 100.4 last night.
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
## 2018-04-21 IMAGING — CT CT ABD-PELV W/ CM
2 of 4 series · 16 of 46 positions shown, 18 images · IV contrast (ISOVUE)
Comparison: None.

CLINICAL DATA: 22-year-old female with acute RIGHT abdominal and
pelvic pain for 1 day.

EXAM:
CT ABDOMEN AND PELVIS WITH CONTRAST
TECHNIQUE: Multidetector CT imaging of the abdomen and pelvis was performed
using the standard protocol following bolus administration of
intravenous contrast.
CONTRAST:  100mL D1Z14C-KUU IOPAMIDOL (D1Z14C-KUU) INJECTION 61%

[Series 2: axial st · axial · 0.71mm/px · z∈[-442,-62]mm · 13 of 86 slices shown, 15 images]
[im 5/86  soft-tissue]
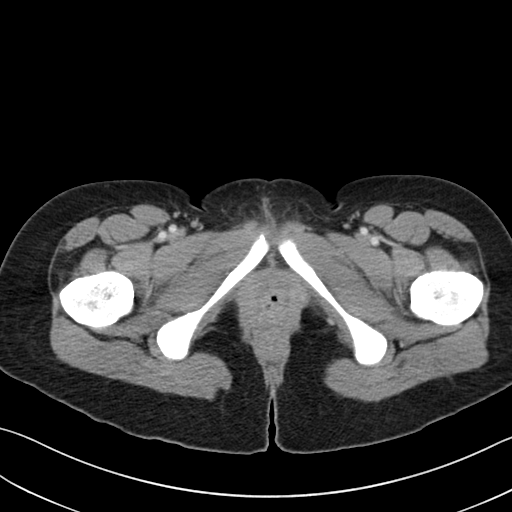
[im 5/86  bone]
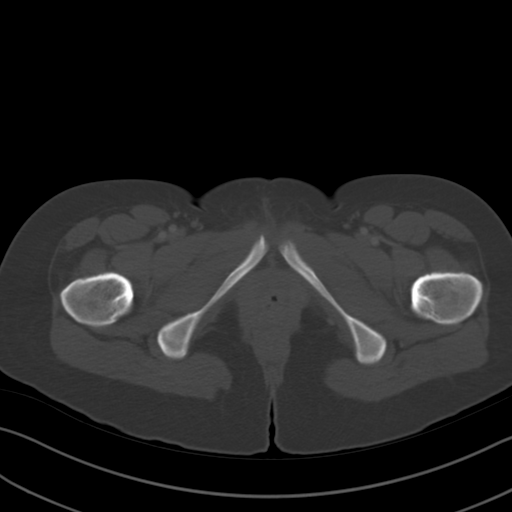
[im 13/86  soft-tissue]
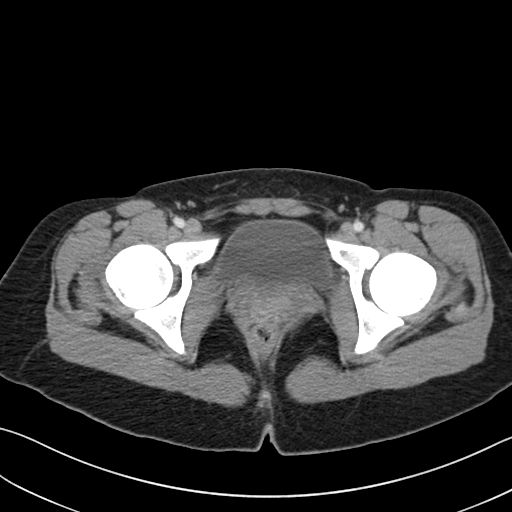
[im 18/86  soft-tissue]
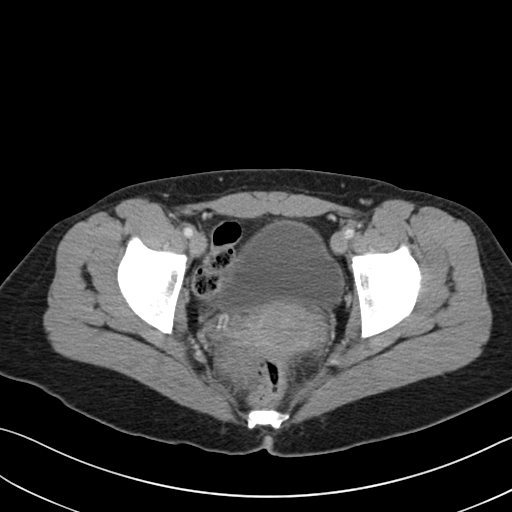
[im 26/86  soft-tissue]
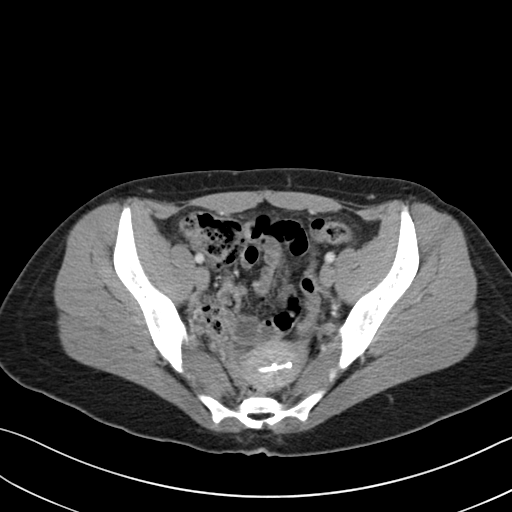
[im 30/86  soft-tissue]
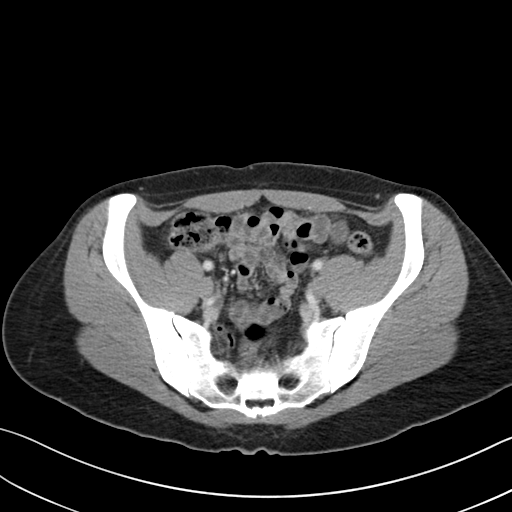
[im 39/86  soft-tissue]
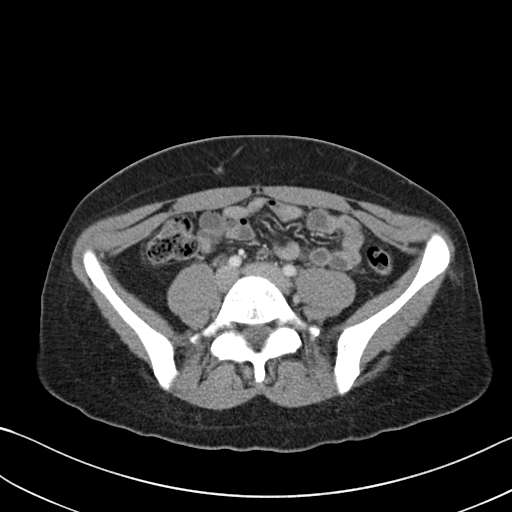
[im 43/86  soft-tissue]
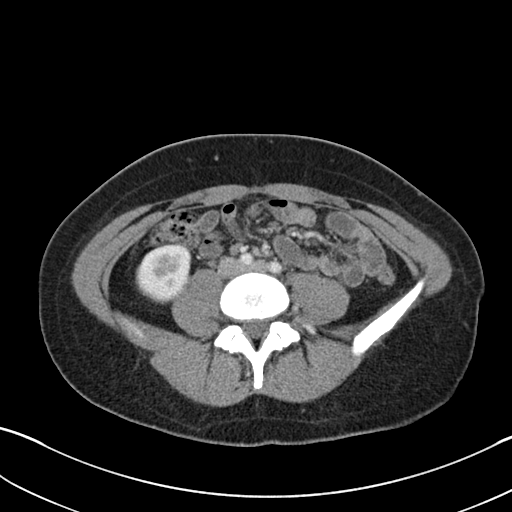
[im 47/86  soft-tissue]
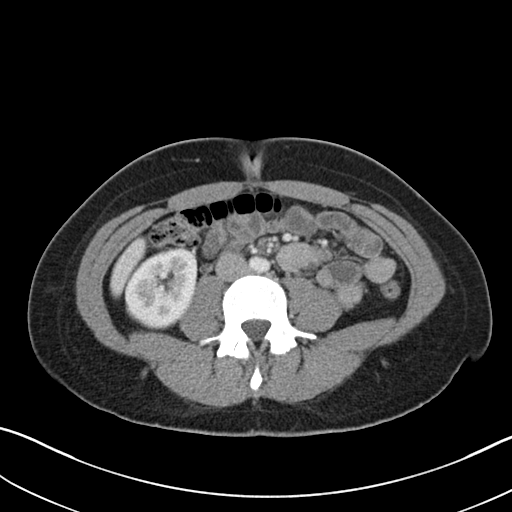
[im 56/86  soft-tissue]
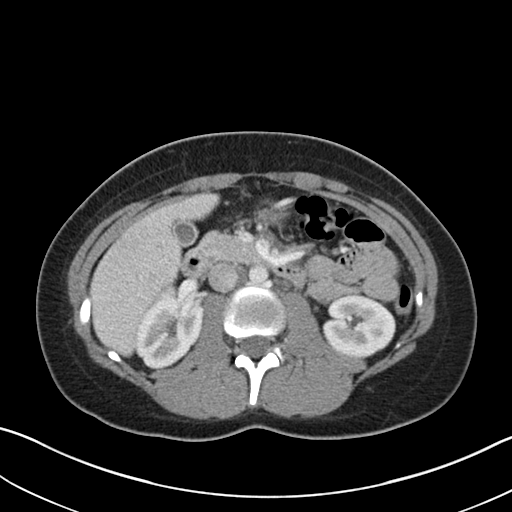
[im 56/86  bone]
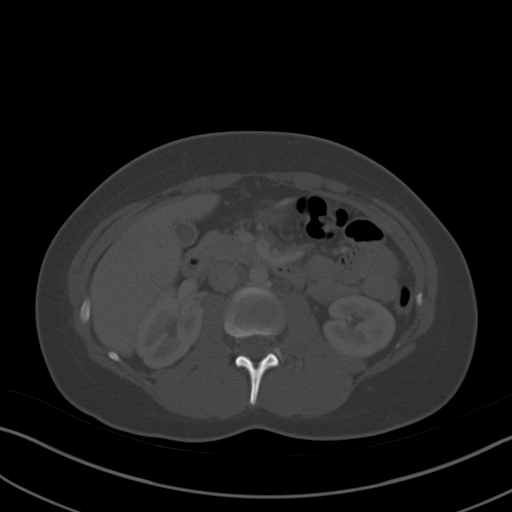
[im 60/86  soft-tissue]
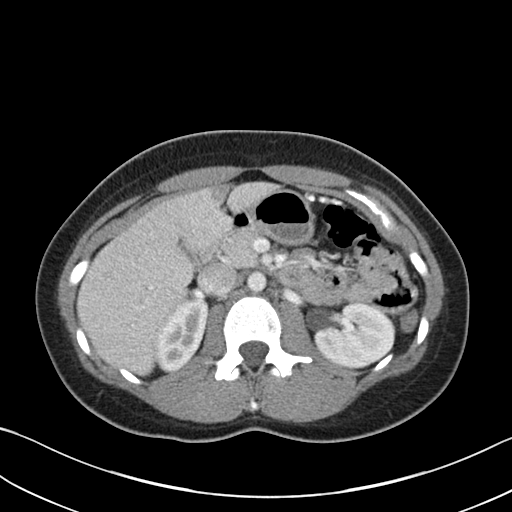
[im 69/86  soft-tissue]
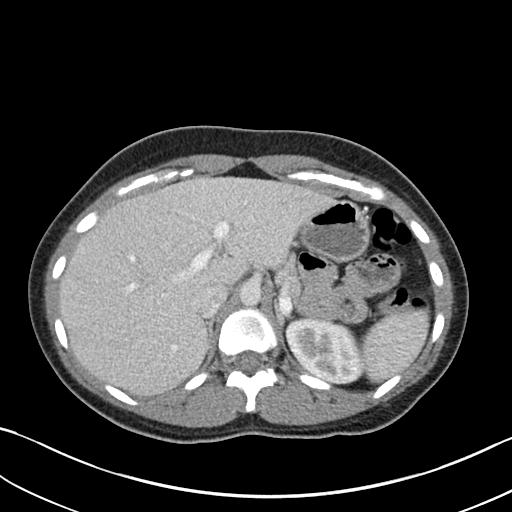
[im 73/86  soft-tissue]
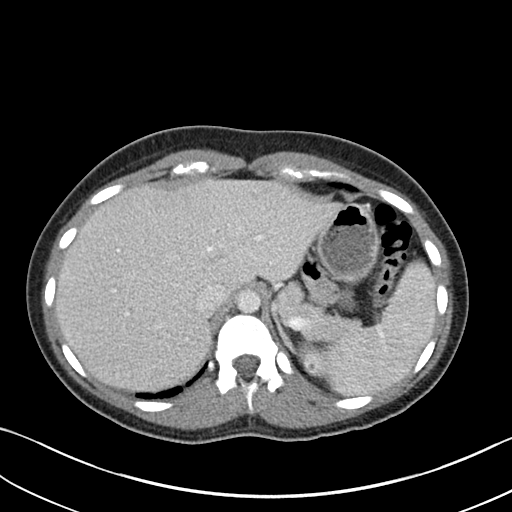
[im 81/86  soft-tissue]
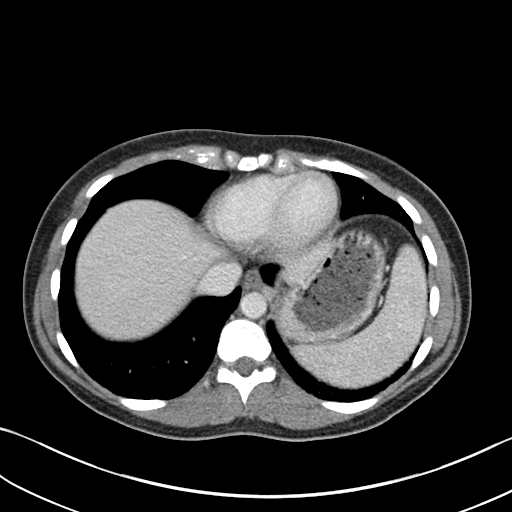

[Series 5: coronal st · coronal · 0.66mm/px · 3 of 74 slices shown]
[im 25/74  soft-tissue]
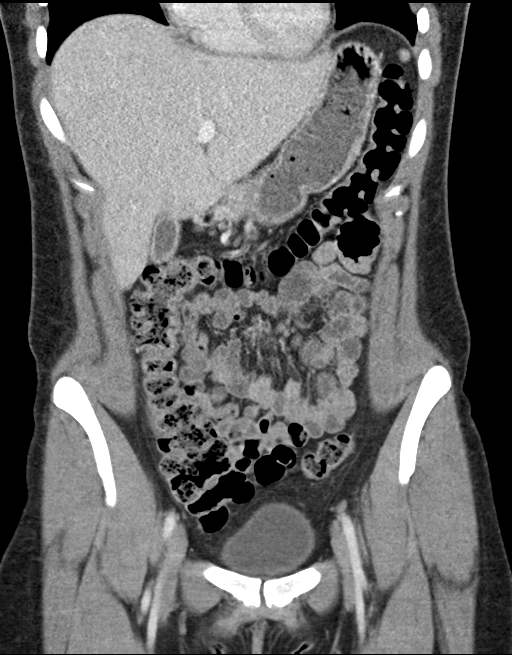
[im 33/74  soft-tissue]
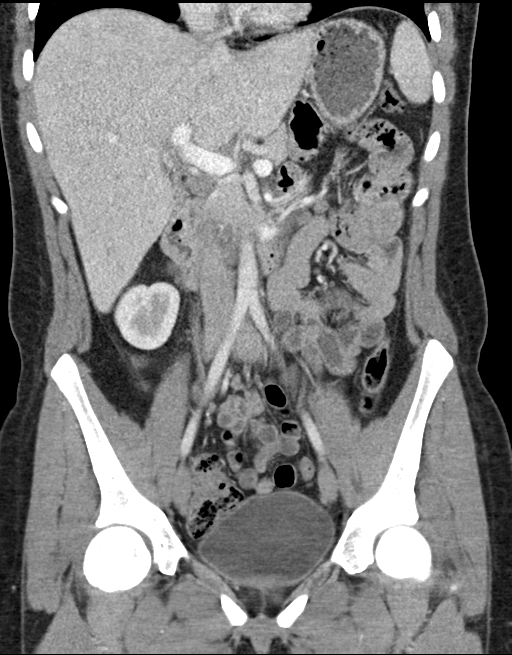
[im 41/74  soft-tissue]
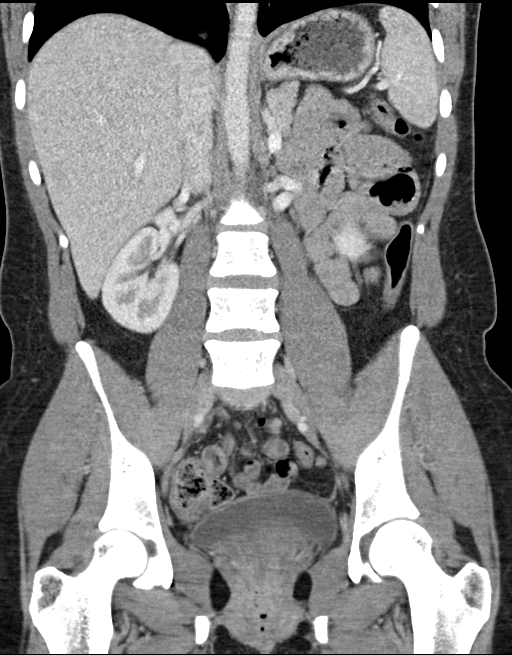

[16 of 46 positions shown; findings below may reference images not displayed]

FINDINGS: Lower chest: No acute abnormality.

Hepatobiliary: Liver and gallbladder are unremarkable. No biliary
dilatation.

Pancreas: Unremarkable

Spleen: Unremarkable

Adrenals/Urinary Tract: The kidneys, adrenal glands and bladder are
unremarkable.

Stomach/Bowel: Stomach is within normal limits. Appendix appears
normal. No evidence of bowel wall thickening, distention, or
inflammatory changes.

Vascular/Lymphatic: No significant vascular findings are present. No
enlarged abdominal or pelvic lymph nodes.

Reproductive: A trace amount of free pelvic fluid may be
physiologic. An IUD is identified. No suspicious adnexal mass.

Other: No abscess or pneumoperitoneum.

Musculoskeletal: No acute or significant osseous findings.
IMPRESSION: 1. Trace free pelvic fluid which may be physiologic. No other acute
abnormalities.
2. Normal appendix.

## 2018-04-27 ENCOUNTER — Encounter: Payer: Self-pay | Admitting: Family Medicine

## 2018-09-17 ENCOUNTER — Telehealth: Payer: Self-pay | Admitting: Obstetrics and Gynecology

## 2018-09-17 NOTE — Telephone Encounter (Signed)
Patient has a lump in her left breast.  °

## 2018-09-17 NOTE — Telephone Encounter (Signed)
Call to patient. Patient states she noticed an "almond" sized lump in her left breast last night. Patient has [place] IUD and states she is not currently having cycles with IUD. Patient states lump was not initially tender, but after rubbing it, it has become more tender. Denies fever or redness. OV recommended. Patient states she is currently on a plane flying to [place] and will not arrive until around 4pm today. OV scheduled for 09-18-2018 at 0800. Patient agreeable to date and time of appointment. Patient also advised overdue for aex. Patient declines to schedule at this time. States she will schedule when she comes in for appointment tomorrow.   Routing to provider and will close encounter.

## 2018-09-18 ENCOUNTER — Other Ambulatory Visit: Payer: Self-pay

## 2018-09-18 ENCOUNTER — Ambulatory Visit (INDEPENDENT_AMBULATORY_CARE_PROVIDER_SITE_OTHER): Payer: Managed Care, Other (non HMO) | Admitting: Obstetrics and Gynecology

## 2018-09-18 ENCOUNTER — Encounter: Payer: Self-pay | Admitting: Obstetrics and Gynecology

## 2018-09-18 VITALS — BP 110/62 | HR 76 | Resp 16 | Ht 68.0 in | Wt 169.0 lb

## 2018-09-18 DIAGNOSIS — N6320 Unspecified lump in the left breast, unspecified quadrant: Secondary | ICD-10-CM | POA: Diagnosis not present
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
## 2018-09-18 NOTE — Progress Notes (Signed)
Patient scheduled with  [facility] Mammography appointment for Dx mammogram and L Breast Ultrasound for 09/24/2018 at 1115.   Spoke with Shanda Bumps at [facility] and patient aware radiologist would like to perform ultrasound first, prior to any diagnostic imaging.  Discussed patient concern and family history.

## 2018-09-24 ENCOUNTER — Encounter: Payer: Self-pay | Admitting: Obstetrics and Gynecology

## 2018-10-01 ENCOUNTER — Ambulatory Visit: Payer: Managed Care, Other (non HMO) | Admitting: Obstetrics and Gynecology

## 2018-10-03 ENCOUNTER — Telehealth: Payer: Self-pay | Admitting: Obstetrics and Gynecology

## 2018-10-03 NOTE — Telephone Encounter (Signed)
Left breast US report 09/24/18 received from [facility].  Patient has normal imaging.  Ok to remove from mammogram/imaging hold. She presented with a left breast lump and needs a recheck with me.   Cc- Tracy Fast.

## 2018-10-03 NOTE — Telephone Encounter (Signed)
Spoke with patient. Advised of message as seen below from Dr.Silva. Patient states that she wants to wait to schedule follow up as she is due for an annual exam and will reschedule this after Covid 19 state of emergency is lifted. Wants to have breast recheck at that appointment.  Routing to provider and will close encounter.

## 2019-02-22 ENCOUNTER — Ambulatory Visit (INDEPENDENT_AMBULATORY_CARE_PROVIDER_SITE_OTHER): Payer: Managed Care, Other (non HMO) | Admitting: Family Medicine

## 2019-02-22 ENCOUNTER — Encounter: Payer: Self-pay | Admitting: Family Medicine

## 2019-02-22 VITALS — BP 120/78 | HR 84 | Temp 97.9°F | Ht 68.0 in | Wt 162.8 lb

## 2019-02-22 DIAGNOSIS — R5383 Other fatigue: Secondary | ICD-10-CM | POA: Diagnosis not present

## 2019-02-22 DIAGNOSIS — R6889 Other general symptoms and signs: Secondary | ICD-10-CM

## 2019-02-22 LAB — TSH: TSH: 1.38 u[IU]/mL (ref 0.35–4.50)

## 2019-02-22 LAB — T4, FREE: Free T4: 1.07 ng/dL (ref 0.60–1.60)

## 2019-02-22 NOTE — Progress Notes (Signed)
Kathy Cole is a 23 y.o. female  Chief Complaint  Patient presents with  . Establish Care    est care/ thyroid consult? pt states her throat has been catching, cold always, swelling in throat   HPI: Kathy Cole is a 23 y.o. female here to establish care with our office. Previous PCP was Dr. Delia Chimes (Primary Care at [facility]).  Pt would like to have her thyroid function checked. She states she is "always cold" and her "temperature is never above 97 degrees". She endorses fatigue x years and well as myalgias. She has had work-up by previous PCP and rheum. Diagnosed with fibromyalgia, IBS about 2+ years ago.   In the past week she has noted 1-2 times where her "throat catches". No dysphagia. No sore throat. No PND, runny nose, nasal congestion. She has trouble explaining further what she is experiencing but wonders if this is related to her thyroid.  She notes seasonal allergies in the spring, not currently having symptoms.   Fam hx - PGM with hypothyroidism.   Specialists: GYN (Dr. Josefa Half), GI (Dr. Wilfrid Lund) Last PAP: 03/2017 (with GYN)  Past Medical History:  Diagnosis Date  . Anxiety   . Bipolar depression ([place])   . Depression   . Erosive gastropathy   . Family history of breast cancer   . Fibromyalgia   . IBS (irritable bowel syndrome)     Past Surgical History:  Procedure Laterality Date  . GANGLION CYST EXCISION      Social History   Socioeconomic History  . Marital status: Single    Spouse name: Not on file  . Number of children: Not on file  . Years of education: Not on file  . Highest education level: Not on file  Occupational History  . Occupation: Ship broker  Social Needs  . Financial resource strain: Not on file  . Food insecurity    Worry: Not on file    Inability: Not on file  . Transportation needs    Medical: Not on file    Non-medical: Not on file  Tobacco Use  . Smoking status: Current Some Day Smoker    Packs/day: 0.25    Years: 0.00    Pack years: 0.00    Types: Cigarettes  . Smokeless tobacco: Never Used  . Tobacco comment: also has an E cigarette  Substance and Sexual Activity  . Alcohol use: No    Alcohol/week: 0.0 standard drinks  . Drug use: Yes    Types: Marijuana    Comment: THC once month 1/8th gram  . Sexual activity: Yes    Birth control/protection: I.U.D.    Comment: Kyleena   Lifestyle  . Physical activity    Days per week: Not on file    Minutes per session: Not on file  . Stress: Not on file  Relationships  . Social Herbalist on phone: Not on file    Gets together: Not on file    Attends religious service: Not on file    Active member of club or organization: Not on file    Attends meetings of clubs or organizations: Not on file    Relationship status: Not on file  . Intimate partner violence    Fear of current or ex partner: Not on file    Emotionally abused: Not on file    Physically abused: Not on file    Forced sexual activity: Not on file  Other Topics Concern  . Not  on file  Social History Narrative  . Not on file    Family History  Problem Relation Age of Onset  . Depression Mother   . Anxiety disorder Mother   . Other Mother        "pre-cancerous" breast cells  . Colon polyps Father   . Alcoholism Father   . Hypertension Father   . Irritable bowel syndrome Sister   . Stroke Paternal Grandfather   . Heart Problems Paternal Grandfather   . Breast cancer Other        mother's paternal aunt and paternal grandmother  . Breast cancer Other        father's maternal grandmother     Immunization History  Administered Date(s) Administered  . HPV 9-valent 03/28/2017  . Influenza-Unspecified 05/11/2016  . Tdap 03/28/2017    Outpatient Encounter Medications as of 02/22/2019  Medication Sig  . Levonorgestrel (KYLEENA) 19.5 MG IUD by Intrauterine route.   No facility-administered encounter medications on file as of 02/22/2019.      ROS: Pertinent positives and  negatives noted in HPI. Remainder of ROS non-contributory   No Known Allergies  BP 120/78   Pulse 84   Temp 97.9 F (36.6 C) (Oral)   Ht 5\' 8"  (1.727 m)   Wt 162 lb 12.8 oz (73.8 kg)   SpO2 97%   BMI 24.75 kg/m   Physical Exam  Constitutional: She is oriented to person, place, and time.  HENT:  Mouth/Throat: Oropharynx is clear and moist.  Neck: Neck supple. No thyromegaly present.  Cardiovascular: Normal rate, regular rhythm and normal heart sounds.  No murmur heard. Musculoskeletal:        General: No edema.  Lymphadenopathy:    She has no cervical adenopathy.  Neurological: She is alert and oriented to person, place, and time.  Psychiatric: She has a normal mood and affect. Her behavior is normal.     A/P:  1. Cold intolerance 2. Fatigue, unspecified type - pt has had extensive workup previously by PCP, rheum - dx with fibromyalgia, IBS, anxiety, depression - pt does have fam h/o hypothyroidism so will check TFTs - TSH - T4, free - T3

## 2019-02-23 LAB — T3: T3, Total: 112 ng/dL (ref 76–181)

## 2019-02-26 ENCOUNTER — Ambulatory Visit: Payer: Managed Care, Other (non HMO) | Admitting: Emergency Medicine
